# Patient Record
Sex: Female | Born: 1975 | Race: White | Hispanic: No | Marital: Married | Smoking: Never smoker
Health system: Southern US, Community
[De-identification: ages and names within clinical notes are randomized; demographics above are authoritative.]

## PROBLEM LIST (undated history)

## (undated) DIAGNOSIS — F32A Depression, unspecified: Secondary | ICD-10-CM

## (undated) DIAGNOSIS — E119 Type 2 diabetes mellitus without complications: Secondary | ICD-10-CM

## (undated) DIAGNOSIS — E1140 Type 2 diabetes mellitus with diabetic neuropathy, unspecified: Secondary | ICD-10-CM

## (undated) DIAGNOSIS — E063 Autoimmune thyroiditis: Secondary | ICD-10-CM

## (undated) DIAGNOSIS — E109 Type 1 diabetes mellitus without complications: Secondary | ICD-10-CM

## (undated) DIAGNOSIS — E785 Hyperlipidemia, unspecified: Secondary | ICD-10-CM

## (undated) DIAGNOSIS — F329 Major depressive disorder, single episode, unspecified: Secondary | ICD-10-CM

## (undated) HISTORY — DX: Type 1 diabetes mellitus without complications (CMS HCC): E10.9

## (undated) HISTORY — DX: Type 2 diabetes mellitus with diabetic neuropathy, unspecified (CMS HCC): E11.40

## (undated) HISTORY — PX: HX TONSILLECTOMY: SHX27

## (undated) HISTORY — DX: Hyperlipidemia, unspecified: E78.5

## (undated) HISTORY — DX: Depression, unspecified: F32.A

## (undated) HISTORY — PX: TEAR DUCT SURGERY: SHX793

## (undated) HISTORY — DX: Autoimmune thyroiditis: E06.3

## (undated) HISTORY — DX: Type 2 diabetes mellitus without complications: E11.9

## (undated) HISTORY — PX: TEAR DUCT PROBING: SHX793

## (undated) HISTORY — PX: CYST REMOVAL LEG: SHX6280

---
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
## 1999-08-10 HISTORY — PX: TONSILLECTOMY: SUR1361

## 2012-03-13 ENCOUNTER — Ambulatory Visit (HOSPITAL_COMMUNITY): Payer: Self-pay | Admitting: "Endocrinology

## 2014-08-09 HISTORY — PX: BUNIONECTOMY: SHX129

## 2015-05-10 HISTORY — PX: HX FOOT SURGERY: 2100001154

## 2016-07-15 ENCOUNTER — Encounter (HOSPITAL_BASED_OUTPATIENT_CLINIC_OR_DEPARTMENT_OTHER): Payer: Self-pay | Admitting: "Endocrinology

## 2016-07-16 ENCOUNTER — Encounter (HOSPITAL_BASED_OUTPATIENT_CLINIC_OR_DEPARTMENT_OTHER): Payer: Self-pay | Admitting: "Endocrinology

## 2016-07-16 ENCOUNTER — Ambulatory Visit (INDEPENDENT_AMBULATORY_CARE_PROVIDER_SITE_OTHER): Payer: BLUE CROSS/BLUE SHIELD | Admitting: "Endocrinology

## 2016-07-16 VITALS — BP 100/68 | Ht 64.0 in | Wt 164.0 lb

## 2016-07-16 DIAGNOSIS — Z6828 Body mass index (BMI) 28.0-28.9, adult: Secondary | ICD-10-CM

## 2016-07-16 DIAGNOSIS — IMO0002 Reserved for concepts with insufficient information to code with codable children: Secondary | ICD-10-CM

## 2016-07-16 DIAGNOSIS — E1065 Type 1 diabetes mellitus with hyperglycemia: Secondary | ICD-10-CM

## 2016-07-16 DIAGNOSIS — E039 Hypothyroidism, unspecified: Secondary | ICD-10-CM

## 2016-07-16 LAB — POCT BLOOD GLUCOSE/FINGERSTICK (AMB): GLUCOSE, POINT OF CARE: 112

## 2016-07-16 LAB — POCT HGB A1C: POCT HGB A1C: 8.1 % — AB (ref 4–6)

## 2016-07-16 MED ORDER — LEVOTHYROXINE 125 MCG TABLET
125.0000 ug | ORAL_TABLET | Freq: Every day | ORAL | 3 refills | Status: DC
Start: 2016-07-16 — End: 2018-10-02

## 2016-07-16 MED ORDER — BLOOD SUGAR DIAGNOSTIC STRIPS
10.0000 | ORAL_STRIP | Freq: Every day | 3 refills | Status: DC
Start: 2016-07-16 — End: 2016-07-16

## 2016-07-16 MED ORDER — LANCETS
1.0000 | Freq: Every day | 3 refills | Status: AC
Start: 2016-07-16 — End: ?

## 2016-07-16 MED ORDER — BLOOD SUGAR DIAGNOSTIC STRIPS
10.0000 | ORAL_STRIP | Freq: Every day | 3 refills | Status: DC
Start: 2016-07-16 — End: 2017-11-07

## 2016-07-16 NOTE — Progress Notes (Signed)
[facility]  [address]  [place] [address]  [phone]    Date: 07/16/2016  Name: Audrey Barker  Age: 40 y.o.    Chief Complaint: Diabetes (monitoring blood sugar 6 times daily)    HPI:    Audrey Barker is a 40 y.o. female with T1DM and Hypothyroid on insulin pump. Works night shifts in ICU in [employer] and BS's are very erratic. A1C today is 8.1% which is really high for Audrey Barker.  Pump/meter download reveals no real pattern.  Current health problems  There are no active problems to display for this patient.      Past Medical History:   Diagnosis Date   . Depression    . Diabetes mellitus type 1 ([place])    . Diabetic neuropathy ([place])    . Hashimoto's thyroiditis    . Hyperlipidemia          Past Surgical History:   Procedure Laterality Date   . HX FOOT SURGERY Right 05/2015   . HX TONSILLECTOMY     . TEAR DUCT SURGERY             Outpatient Medications Prior to Visit:  aspirin (ECOTRIN) 81 mg Oral Tablet, Delayed Release (E.C.) Once a day   escitalopram oxalate (LEXAPRO) 20 mg Oral Tablet Once a day   GLUCAGON,HUMAN RECOMBINANT (GLUCAGON EMERGENCY KIT, HUMAN, INJ) Once, as needed   insulin aspart (NOVOLOG) 100 unit/mL Subcutaneous Solution Once a day Max dose 80 units daily   Norgestrel-Ethinyl Estradiol (CRYSELLE, 28,) 0.3-30 mg-mcg Oral Tablet Once a day   Sub-Q Infusion Pump Access (SURE-T INFUSION SET) Misc Every 3 days 23" 24m   Blood Sugar Diagnostic Strip Once a day 8-10 times daily   levothyroxine (SYNTHROID) 125 mcg Oral Tablet Once a day   ONETOUCH ULTRASOFT LANCETS N/A Once a day 8-10 times daily     No facility-administered medications prior to visit.    Family Medical History     Problem Relation (Age of Onset)    Addison's disease Multiple family members    Diabetes Multiple family members, Maternal Grandfather    Heart Attack Maternal Grandfather    Heart Disease Multiple family members    High Cholesterol Multiple family members    Hypertension Multiple family members    Hypothyroidism Multiple  family members    Thyroid Disease Mother            Social History     Social History   . Marital status: Single     Spouse name: N/A   . Number of children: N/A   . Years of education: N/A     Occupational History   . Not on file.     Social History Main Topics   . Smoking status: Never Smoker   . Smokeless tobacco: Never Used   . Alcohol use No   . Drug use: Not on file   . Sexual activity: Not on file     Other Topics Concern   . Not on file     Social History Narrative   . No narrative on file        There is no immunization history on file for this patient.      ROS:   Constitutional: negative  Eyes: negative, See's a Dr. GRaquel Sarnain FCaleand no retinopathy  HEENT: negative  Respiratroy: negative  Cardiology: negative  GI: negative  GU: negative  Musculoskeletal:negative  Neuro: negative  Endocrine: Onset of T1DM as child  OBJECTIVE:   BP 100/68  Ht 1.626 m (_0 )  Wt 74.4 kg (164 lb)  BMI 28.15 kg/m2   Body mass index is 28.15 kg/(m^2).   Exam:   General:  Alert, cooperative, no distress, appears stated age.   Head:  Normocephalic, without obvious abnormality, atraumatic.   Eyes:  Conjunctivae/corneas clear. PERRL, EOMs intact. Fundi; not examined   Ears:  Not examined   Nose: Nares normal. Septum midline. Mucosa normal. No drainage or sinus tenderness.   Throat: Lips, mucosa, and tongue normal. Teeth and gums normal.   Neck: Supple, symmetrical, trachea midline, no adenopathy, thyroid: no enlargment/tenderness/nodules, no carotid bruit and no JVD.   Back:   Symmetric, no curvature. ROM normal. No CVA tenderness.   Lungs:   Clear to auscultation bilaterally.   Chest wall:  No tenderness or deformity.   Heart:  Regular rate and rhythm, S1, S2 normal, no murmur, click, rub or gallop.   Abdomen:   Not examined.   Genitalia:  Not examined    Rectal:  Not examined   Extremities: Extremities normal, atraumatic, no cyanosis or edema.    Pulses: 2+ and symmetric all extremities.   Skin: Skin color, texture, turgor  normal. No rashes or lesions   Lymph nodes: Cervical, supraclavicular, and axillary nodes normal.   Neurologic: CNII-XII intact.          ASSESSMENT & PLAN:       ICD-10-CM    1. Type 1 diabetes mellitus, uncontrolled (HCC) E10.65 MULTIVITAMIN (MULTIPLE VITAMIN ORAL)     POCT BLOOD GLUCOSE/FINGERSTICK (AMB)     POCT HGB A1C   2. Hypothyroidism, unspecified type E03.9        Audrey Barker BS's are out of control and I believe she needs the Medtronic 670 pump.  I will review Audrey Barker schedules and e-mail Audrey Barker new pump setting with a work pattern and a off work pattenr  Return in about 6 months (around 01/14/2017).        Dominga Ferry, MD  07/16/2016, 12:59

## 2016-08-22 ENCOUNTER — Other Ambulatory Visit: Payer: Self-pay

## 2016-11-12 ENCOUNTER — Other Ambulatory Visit (HOSPITAL_BASED_OUTPATIENT_CLINIC_OR_DEPARTMENT_OTHER): Payer: Self-pay | Admitting: "Endocrinology

## 2017-01-14 ENCOUNTER — Encounter (HOSPITAL_BASED_OUTPATIENT_CLINIC_OR_DEPARTMENT_OTHER): Payer: Self-pay | Admitting: "Endocrinology

## 2017-01-14 ENCOUNTER — Ambulatory Visit (INDEPENDENT_AMBULATORY_CARE_PROVIDER_SITE_OTHER): Payer: BLUE CROSS/BLUE SHIELD | Admitting: "Endocrinology

## 2017-01-14 ENCOUNTER — Other Ambulatory Visit (HOSPITAL_BASED_OUTPATIENT_CLINIC_OR_DEPARTMENT_OTHER): Payer: Self-pay | Admitting: "Endocrinology

## 2017-01-14 VITALS — BP 104/60 | Ht 64.0 in | Wt 162.0 lb

## 2017-01-14 DIAGNOSIS — E039 Hypothyroidism, unspecified: Secondary | ICD-10-CM

## 2017-01-14 DIAGNOSIS — E1065 Type 1 diabetes mellitus with hyperglycemia: Secondary | ICD-10-CM

## 2017-01-14 DIAGNOSIS — IMO0002 Reserved for concepts with insufficient information to code with codable children: Secondary | ICD-10-CM

## 2017-01-14 LAB — POCT BLOOD GLUCOSE/FINGERSTICK (AMB): GLUCOSE, POINT OF CARE: 143

## 2017-01-14 MED ORDER — GLUCAGON 1 MG/ML SOLUTION FOR INJECTION
INTRAMUSCULAR | 3 refills | Status: AC
Start: 2017-01-14 — End: ?

## 2017-01-14 NOTE — Progress Notes (Signed)
[facility]  [address]  [place], [place] [id number]          Audrey GipStacy N Gilbertson  Female  04/18/1976, Age: 41 y.o.  [id number]    Date of Service: 01/14/2017    Chief complaint: Diabetes (monitoring blood sugar 4-6 times daily)      HISTORY OF PRESENT ILLNESS:     Audrey Barker is a 41 y.o. female with child onset T1 DM and hypothyroidism on the insulin pump.  She is doing well, her A1c is down to 7.4% and she is having less frequent hypoglycemia.  She is still working night shift and in ICU in [place] in the major pattern she has noticed is when she is not working her morning blood sugar goes up between 7:00 a.m. and 10:00 a.m..     Current health problems:  Past Medical History:   Diagnosis Date   . Depression    . Diabetes mellitus type 1 (HCC)    . Diabetic neuropathy (HCC)    . Hashimoto's thyroiditis    . Hyperlipidemia            Allergies:  Allergies   Allergen Reactions   . Aleve [Naproxen Sodium]        Current medications:  Outpatient Prescriptions Marked as Taking for the 01/14/17 encounter (Office Visit) with Latrelle DodrillSchwartz, Maragret Vanacker Lee, MD   Medication Sig   . aspirin (ECOTRIN) 81 mg Oral Tablet, Delayed Release (E.C.) Once a day   . Blood Sugar Diagnostic Strip 10 Strips by Apply Topically route Once a day 8-10 times daily   . escitalopram oxalate (LEXAPRO) 20 mg Oral Tablet Once a day   . Lancets (ONETOUCH ULTRASOFT LANCETS) Misc 1 Container by Apply externally route Once a day 8-10 times daily   . levothyroxine (SYNTHROID) 125 mcg Oral Tablet Take 1 Tab (125 mcg total) by mouth Once a day   . MULTIVITAMIN (MULTIPLE VITAMIN ORAL) Take 1 Tab by mouth Once a day   . Norgestrel-Ethinyl Estradiol (CRYSELLE, 28,) 0.3-30 mg-mcg Oral Tablet Once a day   . NOVOLOG U-100 INSULIN ASPART 100 unit/mL Subcutaneous Solution INJECT MAXIMUM 80 UNITS UNDER THE SKIN DAILY VIA INSULIN PUMP   . Sub-Q Infusion Pump Access (SURE-T INFUSION SET) Misc Every 3 days 23" 6mm       Health  history:    There is no immunization history on file for this patient.    SUBJECTIVE:    Review of Systems   Constitutional: Negative.    HENT: Negative.    Eyes:        No retinopathy & get annual eye exam [place] with Dr. Berneice GandyGrubb   Respiratory: Negative.    Cardiovascular: Negative.    Gastrointestinal: Negative.    Genitourinary: Negative.    Musculoskeletal: Negative.    Skin: Negative.    Neurological: Negative.    Endo/Heme/Allergies:        T1DM & hypothyroid and doing well   Psychiatric/Behavioral: Negative.        Diabetes Monitors  A1C - Glucose - Lipids Microalbumin   Results in Last 18 Months   Lab Test 07/16/16 01/14/17   POCTHA1C  8.1*  7.4*    No results for input(s): MICALBRNUR, MICALBCRERAT in the last [id number] hours.       OBJECTIVE:     BP 104/60  Ht 1.626 m (5\' 4" )  Wt 73.5 kg (162 lb)  BMI 27.81 kg/m2  Physical Exam   Constitutional: She is well-developed, well-nourished, and in no distress.   HENT:   Head: Normocephalic.   Eyes: Conjunctivae and EOM are normal. Pupils are equal, round, and reactive to light.   Neck: Normal range of motion. Neck supple.   Cardiovascular: Normal rate, regular rhythm and normal heart sounds.    Pulmonary/Chest: Effort normal and breath sounds normal.   Abdominal:   Deferred   Genitourinary:   Genitourinary Comments: Deferred   Musculoskeletal: Normal range of motion.   Neurological: She is alert.   Skin: Skin is warm.   Psychiatric: Affect normal.   Diabetic foot exam:  Both feet without edema or ulcerations. Pulses normal bilaterally. Sensation normal bilaterally      ASSESSMENT & PLAN:     T1 DM; CONTROLLED  HYPOTHYROIDISM     I adjusted her 7:00 a.m. basal rate by 0.025 units because the goes up at 8:30 a.m. by 0.05 units.  If she is still high she could go ahead and increase the 7:00 a.m. basal to match her 8:30 a.m. basal.  We also discussed temporary basal post exercise because she goes low after exercise instead of during.  Final she does go low occasionally at  work when she is really busy and a remind her again about using a temporary basal.  She is due for annual labs I printed those out for her to get in [place].    Orders Placed This Encounter   . POCT BLOOD GLUCOSE/FINGERSTICK (AMB)   . POCT HGB A1C               The patient was given the opportunity to ask questions and those questions were answered to the patient's satisfaction. The patient was encouraged to call with any additional questions or concerns.     Discussed with patient effects and side effects of medications. Medication safety was discussed. A copy of the patient's medication list was printed and given to the patient.     The patient was informed to contact the office within 7 business days if a message/lab results/referral/imaging results have not been conveyed to the patient.    Latrelle Dodrill, MD  01/14/2017, 08:44  [facility]  [address]  [place], [place] [address]  Dept: [phone]  Dept Fax: [phone]

## 2017-01-28 ENCOUNTER — Other Ambulatory Visit (HOSPITAL_BASED_OUTPATIENT_CLINIC_OR_DEPARTMENT_OTHER): Payer: Self-pay

## 2017-01-31 NOTE — Progress Notes (Signed)
Patient notified.    Kate SableBeverly Tee Richeson, [place]  0/98/11916/25/2018, 10:19

## 2017-03-28 ENCOUNTER — Ambulatory Visit (HOSPITAL_BASED_OUTPATIENT_CLINIC_OR_DEPARTMENT_OTHER): Payer: Self-pay | Admitting: "Endocrinology

## 2017-07-14 ENCOUNTER — Ambulatory Visit (INDEPENDENT_AMBULATORY_CARE_PROVIDER_SITE_OTHER): Payer: BLUE CROSS/BLUE SHIELD | Admitting: "Endocrinology

## 2017-07-14 ENCOUNTER — Encounter (HOSPITAL_BASED_OUTPATIENT_CLINIC_OR_DEPARTMENT_OTHER): Payer: Self-pay | Admitting: "Endocrinology

## 2017-07-14 VITALS — BP 96/68 | Ht 64.0 in | Wt 166.5 lb

## 2017-07-14 DIAGNOSIS — E039 Hypothyroidism, unspecified: Secondary | ICD-10-CM

## 2017-07-14 DIAGNOSIS — E119 Type 2 diabetes mellitus without complications: Secondary | ICD-10-CM

## 2017-07-14 DIAGNOSIS — E109 Type 1 diabetes mellitus without complications: Secondary | ICD-10-CM

## 2017-07-14 DIAGNOSIS — Z6827 Body mass index (BMI) 27.0-27.9, adult: Secondary | ICD-10-CM

## 2017-07-14 LAB — POCT HGB A1C: POCT HGB A1C: 7.2 % — AB (ref 4–6)

## 2017-07-14 LAB — POCT BLOOD GLUCOSE/FINGERSTICK (AMB): GLUCOSE, POINT OF CARE: 164

## 2017-07-14 NOTE — Progress Notes (Signed)
[facility]  [address]  [place], [place] [id number]          Audrey Barker  Female  02/05/1976, Age: 41 y.o.  [id number]    Date of Service: 07/14/2017    Chief complaint: Diabetes (checks BS 6+ times per day)      HISTORY OF PRESENT ILLNESS:     Audrey Barker is a 41 y.o. female with child onset T1 DM and hypothyroidism on the insulin pump.  She is doing well, getting the new Medtronic 670 G pump today or tomorrow; however they have not scheduled training [place] in [place] and I advised her to contact them. 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  This will be a day shift she can't week.          Current health problems:  Past Medical History:   Diagnosis Date   . Depression    . Diabetes mellitus type 1 (CMS HCC)    . Diabetic neuropathy (CMS HCC)    . Hashimoto's thyroiditis    . Hyperlipidemia            Allergies:  Allergies   Allergen Reactions   . Aleve [Naproxen Sodium]        Current medications:    Outpatient Prescriptions Marked as Taking for the 07/14/17 encounter (Office Visit) with Latrelle DodrillSchwartz, Heberto Sturdevant Lee, MD   Medication Sig   . aspirin (ECOTRIN) 81 mg Oral Tablet, Delayed Release (E.C.) Once a day   . Blood Sugar Diagnostic Strip 10 Strips by Apply Topically route Once a day 8-10 times daily   . escitalopram oxalate (LEXAPRO) 20 mg Oral Tablet Once a day   . glucagon (GLUCAGEN) 1 mg/mL Injection Recon Soln For use in severe hypoglycemia   . Lancets (ONETOUCH ULTRASOFT LANCETS) Misc 1 Container by Apply externally route Once a day 8-10 times daily   . levothyroxine (SYNTHROID) 125 mcg Oral Tablet Take 1 Tab (125 mcg total) by mouth Once a day   . MULTIVITAMIN  (MULTIPLE VITAMIN ORAL) Take 1 Tab by mouth Once a day   . Norgestrel-Ethinyl Estradiol (CRYSELLE, 28,) 0.3-30 mg-mcg Oral Tablet Once a day   . NOVOLOG U-100 INSULIN ASPART 100 unit/mL Subcutaneous Solution INJECT MAXIMUM 80 UNITS UNDER THE SKIN DAILY VIA INSULIN PUMP   . Sub-Q Infusion Pump Access (SURE-T INFUSION SET) Misc Every 3 days 23" 6mm         SUBJECTIVE:    Review of Systems     Constitutional: Negative.    HEENT: Negative.  Eyes:  No retinopathy & get annual eye exam [place] with Dr. Berneice GandyGrubb   Respiratory: Negative.    Cardiovascular: Negative.    Gastrointestinal: Negative.    Genitourinary: Negative.    Musculoskeletal: Negative.    Skin: Negative.    Neurological: Negative.    Endo/Heme/Allergies: Onset of T1DM in childhood & then hypothyroidism as an adolescent.    Psychiatric/Behavioral: Negative.        Diabetes Monitors  A1C - Glucose - Lipids Microalbumin   Results in Last 18 Months   Lab Test 07/16/16 01/14/17 07/14/17   POCTHA1C  8.1*  7.4*  7.2*    No results for input(s): MICALBRNUR, MICALBCRERAT in the last [id number] hours.       OBJECTIVE:     BP 104/60  Ht 1.626 m (5\' 4" )  Wt 73.5 kg (162 lb)  BMI 27.81 kg/m2     Physical Exam     Constitutional: She is well-developed, well-nourished, middle-aged female and in no distress.   HEENT: Head: Normocephalic. Eyes: Conjunctivae and EOM are normal. Pupils are equal, round, and reactive to light.   Neck: Normal range of motion. Neck supple.   Cardiovascular: Normal rate, regular rhythm and normal heart sounds.    Pulmonary/Chest: Effort normal and breath sounds normal.   Abdominal: Deferred   Genitourinary: Deferred   Musculoskeletal: Normal range of motion.   Neurological: She is alert.   Skin: Skin is warm.   Psychiatric: Affect normal.   Diabetic foot exam:  Both feet without edema or ulcerations. Pulses normal bilaterally. Sensation normal bilaterally      ASSESSMENT & PLAN:     T1 DM; CONTROLLED  HYPOTHYROIDISM     I adjusted her 7:00 a.m. basal rate  by 0.025 units because the goes up at 8:30 a.m. by 0.05 units.  If she is still high she could go ahead and increase the 7:00 a.m. basal to match her 8:30 a.m. basal.  We also discussed temporary basal post exercise because she goes low after exercise instead of during.  Final she does go low occasionally at work when she is really busy and a remind her again about using a temporary basal.  She is due for annual labs I printed those out for [place] get in [place].    Orders Placed This Encounter   . POCT HGB A1C   . POCT BLOOD GLUCOSE/FINGERSTICK (AMB)         Return in about 6 months (around 01/12/2018).      The patient was given the opportunity to ask questions and those questions were answered to the patient's satisfaction. The patient was encouraged to call with any additional questions or concerns.     Discussed with patient effects and side effects of medications. Medication safety was discussed. A copy of the patient's medication list was printed and given to the patient.     The patient was informed to contact the office within 7 business days if a message/lab results/referral/imaging results have not been conveyed to the patient.    Latrelle DodrillFrank Lee Greenlee Ancheta, MD  01/14/2017, 08:44  [facility]  [address]  [place], [place] [id number]  Dept: [phone]  Dept Fax: [phone]

## 2017-07-15 ENCOUNTER — Encounter (HOSPITAL_BASED_OUTPATIENT_CLINIC_OR_DEPARTMENT_OTHER): Payer: Self-pay | Admitting: "Endocrinology

## 2017-08-11 ENCOUNTER — Encounter (HOSPITAL_BASED_OUTPATIENT_CLINIC_OR_DEPARTMENT_OTHER): Payer: Self-pay | Admitting: "Endocrinology

## 2017-11-07 ENCOUNTER — Other Ambulatory Visit (HOSPITAL_BASED_OUTPATIENT_CLINIC_OR_DEPARTMENT_OTHER): Payer: Self-pay | Admitting: "Endocrinology

## 2017-11-07 MED ORDER — CONTOUR NEXT TEST STRIPS
ORAL_STRIP | 3 refills | Status: DC
Start: 2017-11-07 — End: 2018-10-02

## 2018-01-12 ENCOUNTER — Encounter (HOSPITAL_BASED_OUTPATIENT_CLINIC_OR_DEPARTMENT_OTHER): Payer: Self-pay | Admitting: "Endocrinology

## 2018-01-12 ENCOUNTER — Ambulatory Visit (INDEPENDENT_AMBULATORY_CARE_PROVIDER_SITE_OTHER): Payer: 59 | Admitting: "Endocrinology

## 2018-01-12 VITALS — BP 102/64 | Ht 64.0 in | Wt 170.5 lb

## 2018-01-12 DIAGNOSIS — E039 Hypothyroidism, unspecified: Secondary | ICD-10-CM

## 2018-01-12 DIAGNOSIS — IMO0002 Reserved for concepts with insufficient information to code with codable children: Secondary | ICD-10-CM

## 2018-01-12 DIAGNOSIS — E1065 Type 1 diabetes mellitus with hyperglycemia: Secondary | ICD-10-CM

## 2018-01-12 LAB — POCT BLOOD GLUCOSE/FINGERSTICK (AMB): GLUCOSE, POINT OF CARE: 190

## 2018-01-12 MED ORDER — INSULIN ASPART (NIACINAMIDE)(U-100) 100 UNIT/ML(3 ML) SUBCUTANEOUS PEN
PEN_INJECTOR | SUBCUTANEOUS | Status: DC
Start: 2018-01-12 — End: 2018-10-02

## 2018-01-12 NOTE — Progress Notes (Signed)
[facility]  [address]  [place], [place] [id number]          Audrey GipStacy N Barker  Female  06/19/1976, Age: 42 y.o.  [id number]    Date of Service: 01/12/2018    Chief complaint: Diabetes (checks BS 4-8 times per day)      HISTORY OF PRESENT ILLNESS:     Audrey GipStacy N Barker is a 42 y.o. female with child-onset T1 DM and hypothyroidism on the Medtronic 670 G pump. This is a bi-annual appointment [place] lives in  [place]. Her A1c today is again 7.2% and she is having less frequent hypoglycemia.  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  She feels that the pump is actually keeping her at a target glucose of 170 and therefore get change the target settings on her pump and get it down a little bit better.  Her pump/see GM download does reveal that she is slightly high postprandially but she also admits she is snacking continuously at work because there is food there and she is really going to try to cut that out and increase her exercise       Current health problems:  Past Medical History:   Diagnosis Date   . Depression    . Diabetes mellitus type 1 (CMS HCC)    . Diabetic neuropathy (CMS HCC)    . Hashimoto's thyroiditis    . Hyperlipidemia            Allergies:  Allergies   Allergen Reactions   . Aleve [Naproxen Sodium]        Current medications:    Outpatient Medications Marked as Taking for the 01/12/18 encounter (Office Visit) with Latrelle DodrillSchwartz, Kawana Hegel Lee, MD   Medication Sig   . aspirin (ECOTRIN) 81 mg Oral Tablet, Delayed Release (E.C.) Once a day   . CONTOUR NEXT TEST STRIPS Strip Testing blood sugar 8-10 times daily   . escitalopram oxalate (LEXAPRO) 20 mg Oral Tablet Once a day   . glucagon (GLUCAGEN) 1 mg/mL Injection Recon Soln For use in severe hypoglycemia   . Lancets (ONETOUCH ULTRASOFT LANCETS) Misc 1 Container by Apply externally route Once a day 8-10 times daily   . levothyroxine (SYNTHROID) 125 mcg Oral Tablet Take 1 Tab (125 mcg total) by  mouth Once a day   . MULTIVITAMIN (MULTIPLE VITAMIN ORAL) Take 1 Tab by mouth Once a day   . Norgestrel-Ethinyl Estradiol (CRYSELLE, 28,) 0.3-30 mg-mcg Oral Tablet Once a day   . NOVOLOG U-100 INSULIN ASPART 100 unit/mL Subcutaneous Solution INJECT MAXIMUM 80 UNITS UNDER THE SKIN DAILY VIA INSULIN PUMP   . Sub-Q Infusion Pump Access (SURE-T INFUSION SET) Misc Every 3 days 23" 6mm         SUBJECTIVE:    Review of Systems     Constitutional: Negative.    HEENT: Negative.  Eyes:  No retinopathy & get annual eye exam [place] with Dr. Berneice GandyGrubb   Respiratory: Negative.    Cardiovascular: Negative.    Gastrointestinal: Negative.    Genitourinary: Menses normal on BCP.    Musculoskeletal: Negative.    Skin: Negative.    Neurological: Negative.    Endo/Heme/Allergies: Onset of T1DM in childhood & then hypothyroidism as an adolescent.    Psychiatric/Behavioral: Negative.        Diabetes Monitors  A1C - Glucose - Lipids Microalbumin   Results in Last 18 Months   Lab  Test 01/14/17 07/14/17 01/12/18   POCTHA1C 7.4* 7.2* 7.2*    No results for input(s): MICALBRNUR, MICALBCRERAT in the last 09811 hours.       OBJECTIVE:     BP 104/60  Ht 1.626 m (5\' 4" )  Wt 73.5 kg (162 lb)  BMI 27.81 kg/m2     Physical Exam     Constitutional: She is well-developed, well-nourished, middle-aged female and in no distress.   HEENT: Head: Normocephalic. Eyes: Conjunctivae and EOM are normal. Pupils are equal, round, and reactive to light.   Neck: Normal range of motion. Neck supple.   Cardiovascular: Normal rate, regular rhythm and normal heart sounds.    Pulmonary/Chest: Effort normal and breath sounds normal.   Abdominal: Deferred   Genitourinary: Deferred   Musculoskeletal: Normal range of motion.   Neurological: She is alert.   Skin: Skin is warm.   Psychiatric: Affect normal.   Diabetic foot exam:  Both feet without edema or ulcerations. Pulses normal bilaterally. Sensation normal bilaterally      ASSESSMENT & PLAN:     T1DM;  Controlled  Hypothyroidism      She is due for annual labs I printed those out for her to get in [place].  I gave her sample FiASP to see if the more rapid acting insulin helps her postprandial glucose control and we will have our pump educator adjust the target settings on her pump.    Orders Placed This Encounter   . POCT HGB A1C   . POCT BLOOD GLUCOSE/FINGERSTICK (AMB)         No follow-ups on file.      The patient was given the opportunity to ask questions and those questions were answered to the patient's satisfaction. The patient was encouraged to call with any additional questions or concerns.     Discussed with patient effects and side effects of medications. Medication safety was discussed. A copy of the patient's medication list was printed and given to the patient.     The patient was informed to contact the office within 7 business days if a message/lab results/referral/imaging results have not been conveyed to the patient.    Latrelle Dodrill, MD  01/14/2017, 08:44  [facility]  [address]  [place], [place] [address]  Dept: [phone]  Dept Fax: [phone]

## 2018-01-26 ENCOUNTER — Other Ambulatory Visit (HOSPITAL_BASED_OUTPATIENT_CLINIC_OR_DEPARTMENT_OTHER): Payer: Self-pay

## 2018-01-27 ENCOUNTER — Encounter (HOSPITAL_BASED_OUTPATIENT_CLINIC_OR_DEPARTMENT_OTHER): Payer: Self-pay | Admitting: "Endocrinology

## 2018-01-30 NOTE — Progress Notes (Signed)
Patient notified.    Kate SableBeverly Nathanie Ottley, [place]  4/01/02726/24/2019, 08:49

## 2018-07-13 ENCOUNTER — Encounter (HOSPITAL_BASED_OUTPATIENT_CLINIC_OR_DEPARTMENT_OTHER): Payer: Self-pay | Admitting: "Endocrinology

## 2018-10-02 ENCOUNTER — Encounter (HOSPITAL_BASED_OUTPATIENT_CLINIC_OR_DEPARTMENT_OTHER): Payer: Self-pay | Admitting: "Endocrinology

## 2018-10-02 ENCOUNTER — Other Ambulatory Visit: Payer: Self-pay

## 2018-10-02 ENCOUNTER — Ambulatory Visit (INDEPENDENT_AMBULATORY_CARE_PROVIDER_SITE_OTHER): Payer: 59 | Admitting: "Endocrinology

## 2018-10-02 VITALS — BP 118/70 | Ht 64.0 in | Wt 174.0 lb

## 2018-10-02 DIAGNOSIS — F329 Major depressive disorder, single episode, unspecified: Secondary | ICD-10-CM

## 2018-10-02 DIAGNOSIS — E1065 Type 1 diabetes mellitus with hyperglycemia: Secondary | ICD-10-CM

## 2018-10-02 DIAGNOSIS — Z9641 Presence of insulin pump (external) (internal): Secondary | ICD-10-CM

## 2018-10-02 DIAGNOSIS — F32A Depression, unspecified: Secondary | ICD-10-CM

## 2018-10-02 DIAGNOSIS — E039 Hypothyroidism, unspecified: Secondary | ICD-10-CM

## 2018-10-02 LAB — POCT BLOOD GLUCOSE/FINGERSTICK (AMB): GLUCOSE, POINT OF CARE: 146

## 2018-10-02 LAB — POCT HGB A1C: POCT HGB A1C: 7.5 % — AB (ref 4–6)

## 2018-10-02 MED ORDER — CONTOUR NEXT TEST STRIPS
ORAL_STRIP | 3 refills | Status: DC
Start: 2018-10-02 — End: 2019-10-15

## 2018-10-02 MED ORDER — INSULIN ASPART U-100  100 UNIT/ML SUBCUTANEOUS SOLUTION
SUBCUTANEOUS | 3 refills | Status: DC
Start: 2018-10-02 — End: 2019-10-15

## 2018-10-02 MED ORDER — LEVOTHYROXINE 125 MCG TABLET
125.0000 ug | ORAL_TABLET | Freq: Every day | ORAL | 3 refills | Status: DC
Start: 2018-10-02 — End: 2019-07-26

## 2018-10-02 NOTE — Progress Notes (Signed)
[facility]  [address]  [place], [place] [address]          Audrey Barker  Female  12/31/1975, Age: 43 y.o.  [id number]    Date of Service: 10/02/2018    Chief complaint: Diabetes (monitoring blood sughar 4+ times daily with Guardian CGM)      HISTORY OF PRESENT ILLNESS:     Audrey Barker is a 43 y.o. 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 This is her bi-annual appointment as she lives in [place], [place] in flies up twice a year to see her family the same time. Her A1c today is 7.5% and she is having less frequent hypoglycemia.  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  She feels that the pump is actually keeping her at a target glucose of 170 and therefore get change the target settings on her pump and get it down a little bit better.  Her pump/CGM download does reveal that she is slightly high postprandially but she also admits she is snacking continuously at work because there is food there and she is really going to try to cut that out and increase her exercise       Current health problems:  Past Medical History:   Diagnosis Date   . Depression    . Diabetes mellitus type 1 (CMS HCC)    . Diabetic neuropathy (CMS HCC)    . Hashimoto's thyroiditis    . Hyperlipidemia        Past Surgical History:   Procedure Laterality Date   . HX FOOT SURGERY Right 05/2015   . HX TONSILLECTOMY     . TEAR DUCT SURGERY         Allergies:  Allergies   Allergen Reactions   . Aleve [Naproxen Sodium]      Current Outpatient Medications   Medication Sig   . aspirin (ECOTRIN) 81 mg Oral Tablet, Delayed Release (E.C.) Once a day   . CONTOUR NEXT TEST STRIPS Strip Testing blood sugar 8-10 times daily   . escitalopram oxalate (LEXAPRO) 20 mg Oral Tablet Once a day   . glucagon (GLUCAGEN) 1 mg/mL Injection Recon Soln For use in severe hypoglycemia   . insulin aspart, niacinamide, (FIASP FLEXTOUCH U-100  INSULIN) 100 unit/mL (3 mL) Subcutaneous Insulin Pen Use in insulin pump as directed   . Lancets (ONETOUCH ULTRASOFT LANCETS) Misc 1 Container by Apply externally route Once a day 8-10 times daily   . levothyroxine (SYNTHROID) 125 mcg Oral Tablet Take 1 Tab (125 mcg total) by mouth Once a day   . MULTIVITAMIN (MULTIPLE VITAMIN ORAL) Take 1 Tab by mouth Once a day   . Norgestrel-Ethinyl Estradiol (CRYSELLE, 28,) 0.3-30 mg-mcg Oral Tablet Once a day   . NOVOLOG U-100 INSULIN ASPART 100 unit/mL Subcutaneous Solution INJECT MAXIMUM 80 UNITS UNDER THE SKIN DAILY VIA INSULIN PUMP   . Sub-Q Infusion Pump Access (SURE-T INFUSION SET) Misc Every 3 days 23" 64mm       SUBJECTIVE:    Review of Systems     Constitutional: Negative.    HEENT: Negative.  Eyes:  No retinopathy & gets annual eye exam [place] with Dr. Berneice Gandy   Respiratory: Negative.    Cardiovascular: Negative.    Gastrointestinal: Negative.    Genitourinary: Menses normal on BCP.  Married but does not plan on having children   Musculoskeletal: Negative.  Skin: Negative.    Neurological: Negative.    Endo/Heme/Allergies: Onset of T1DM in childhood & then hypothyroidism as an adolescent, LDL 80/HDL73.  She has gained a little bit a weight this winter.    Psychiatric/Behavioral: Negative.        Diabetes Monitors  A1C - Glucose - Lipids Microalbumin   Results in Last 18 Months   Lab Test 07/14/17 01/12/18   POCTHA1C 7.2* 7.2*    No results for input(s): MICALBRNUR, MICALBCRERAT in the last 00923 hours.       OBJECTIVE:     BP 104/60  Ht 1.626 m (5\' 4" )  Wt 73.5 kg (162 lb)  BMI 27.81 kg/m2     Physical Exam     Constitutional: She is well-developed, well-nourished, middle-aged female and in no distress.   HEENT: Head: Normocephalic. Eyes: Conjunctivae and EOM are normal. Pupils are equal, round, and reactive to light.   Neck: Normal range of motion. Neck supple.   Cardiovascular: Normal rate, regular rhythm and normal heart sounds.    Pulmonary/Chest: Effort normal and  breath sounds normal.   Abdominal: Deferred   Genitourinary: Deferred   Musculoskeletal: Normal range of motion.   Neurological: She is alert.   Skin: Skin is warm.   Psychiatric: Affect normal.   Diabetic foot exam:  Both feet without edema or ulcerations. Pulses normal bilaterally. Sensation normal bilaterally      ASSESSMENT & PLAN:     T1DM; Controlled  Hypothyroidism     I stressed that she be more accurate carb counting and to bolus before she eats.  She is higher postprandially and then develops delayed hypoglycemia which thankfully is prevented by the low glucose threshold suspend on the pump.  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       Orders Placed This Encounter   . POCT BLOOD GLUCOSE/FINGERSTICK (AMB)   . POCT HGB A1C         The patient was given the opportunity to ask questions and those questions were answered to the patient's satisfaction. The patient was encouraged to call with any additional questions or concerns.     Discussed with patient effects and side effects of medications. Medication safety was discussed. A copy of the patient's medication list was printed and given to the patient.     The patient was informed to contact the office within 7 business days if a message/lab results/referral/imaging results have not been conveyed to the patient.      Latrelle Dodrill, MD  10/02/2018, 08:54[facility]  [address]  [place], [place] [address]  Dept: [phone]  Dept Fax: [phone]

## 2018-10-02 NOTE — Patient Instructions (Signed)
Try to be slightly more accurate carb counting and to bolus before you eat

## 2018-12-18 ENCOUNTER — Other Ambulatory Visit (HOSPITAL_BASED_OUTPATIENT_CLINIC_OR_DEPARTMENT_OTHER): Payer: Self-pay

## 2018-12-19 ENCOUNTER — Other Ambulatory Visit (HOSPITAL_BASED_OUTPATIENT_CLINIC_OR_DEPARTMENT_OTHER): Payer: Self-pay | Admitting: "Endocrinology

## 2018-12-19 DIAGNOSIS — E1065 Type 1 diabetes mellitus with hyperglycemia: Secondary | ICD-10-CM

## 2018-12-19 NOTE — Progress Notes (Signed)
Patient notified.    Kate Sable, [place]  02/18/4579, 10:10

## 2019-01-15 ENCOUNTER — Other Ambulatory Visit (HOSPITAL_BASED_OUTPATIENT_CLINIC_OR_DEPARTMENT_OTHER): Payer: Self-pay

## 2019-03-14 ENCOUNTER — Encounter (HOSPITAL_BASED_OUTPATIENT_CLINIC_OR_DEPARTMENT_OTHER): Payer: Self-pay | Admitting: "Endocrinology

## 2019-04-02 ENCOUNTER — Encounter (HOSPITAL_BASED_OUTPATIENT_CLINIC_OR_DEPARTMENT_OTHER): Payer: Self-pay | Admitting: "Endocrinology

## 2019-07-25 ENCOUNTER — Other Ambulatory Visit (HOSPITAL_BASED_OUTPATIENT_CLINIC_OR_DEPARTMENT_OTHER): Payer: Self-pay | Admitting: "Endocrinology

## 2019-07-26 NOTE — Telephone Encounter (Signed)
Pharmacy requesting refill on medication for the pt. Ordered and sent to Dr. Tessa Lerner for sign off and review.  Bess Kinds, LPN  49/82/6415, 83:09

## 2019-08-21 ENCOUNTER — Encounter (HOSPITAL_BASED_OUTPATIENT_CLINIC_OR_DEPARTMENT_OTHER): Payer: Self-pay | Admitting: "Endocrinology

## 2019-08-27 ENCOUNTER — Encounter (HOSPITAL_BASED_OUTPATIENT_CLINIC_OR_DEPARTMENT_OTHER): Payer: Self-pay | Admitting: "Endocrinology

## 2019-10-13 ENCOUNTER — Other Ambulatory Visit (HOSPITAL_BASED_OUTPATIENT_CLINIC_OR_DEPARTMENT_OTHER): Payer: Self-pay | Admitting: "Endocrinology

## 2019-10-16 ENCOUNTER — Ambulatory Visit (HOSPITAL_BASED_OUTPATIENT_CLINIC_OR_DEPARTMENT_OTHER): Payer: Self-pay | Admitting: "Endocrinology

## 2019-10-16 DIAGNOSIS — Z83438 Family history of other disorder of lipoprotein metabolism and other lipidemia: Secondary | ICD-10-CM

## 2019-10-16 DIAGNOSIS — E039 Hypothyroidism, unspecified: Secondary | ICD-10-CM

## 2019-10-16 DIAGNOSIS — E1065 Type 1 diabetes mellitus with hyperglycemia: Secondary | ICD-10-CM

## 2019-10-16 NOTE — Telephone Encounter (Signed)
Patient wants to get lab work done before her insurance in [place] is stopped as she is moving to [place] to be closer to family.        Audrey Barker, [place]  09/12/4008, 27:25

## 2019-11-09 ENCOUNTER — Encounter (HOSPITAL_BASED_OUTPATIENT_CLINIC_OR_DEPARTMENT_OTHER): Payer: Self-pay | Admitting: "Endocrinology

## 2019-11-12 ENCOUNTER — Other Ambulatory Visit (HOSPITAL_BASED_OUTPATIENT_CLINIC_OR_DEPARTMENT_OTHER): Payer: Self-pay

## 2019-11-13 NOTE — Progress Notes (Signed)
Patient notified.    Kate Sable, [place]  11/12/9505, 22:57

## 2019-12-24 ENCOUNTER — Encounter (HOSPITAL_BASED_OUTPATIENT_CLINIC_OR_DEPARTMENT_OTHER): Payer: Self-pay | Admitting: "Endocrinology

## 2020-03-26 ENCOUNTER — Other Ambulatory Visit (HOSPITAL_COMMUNITY): Payer: Self-pay | Admitting: Family Medicine

## 2020-03-26 DIAGNOSIS — Z9641 Presence of insulin pump (external) (internal): Secondary | ICD-10-CM | POA: Diagnosis not present

## 2020-03-26 DIAGNOSIS — Z0000 Encounter for general adult medical examination without abnormal findings: Secondary | ICD-10-CM | POA: Diagnosis not present

## 2020-03-26 DIAGNOSIS — E108 Type 1 diabetes mellitus with unspecified complications: Secondary | ICD-10-CM | POA: Diagnosis not present

## 2020-03-26 DIAGNOSIS — E039 Hypothyroidism, unspecified: Secondary | ICD-10-CM | POA: Diagnosis not present

## 2020-03-26 DIAGNOSIS — F324 Major depressive disorder, single episode, in partial remission: Secondary | ICD-10-CM | POA: Diagnosis not present

## 2020-03-26 DIAGNOSIS — E7800 Pure hypercholesterolemia, unspecified: Secondary | ICD-10-CM | POA: Diagnosis not present

## 2020-03-26 MED FILL — ROSUVASTATIN CALCIUM 10 MG: 10 | 90 days supply | Qty: 90 | Fill #0

## 2020-03-26 MED FILL — LEVOTHYROXINE 125 MCG TABLE: 125 | 90 days supply | Qty: 90 | Fill #0

## 2020-03-26 MED FILL — ESCITALOPRAM 20 MG TABLET: 20 | 90 days supply | Qty: 90 | Fill #0

## 2020-03-27 ENCOUNTER — Other Ambulatory Visit: Payer: Self-pay | Admitting: Family Medicine

## 2020-03-27 DIAGNOSIS — Z1231 Encounter for screening mammogram for malignant neoplasm of breast: Secondary | ICD-10-CM

## 2020-03-31 DIAGNOSIS — Z794 Long term (current) use of insulin: Secondary | ICD-10-CM | POA: Diagnosis not present

## 2020-03-31 DIAGNOSIS — E109 Type 1 diabetes mellitus without complications: Secondary | ICD-10-CM | POA: Diagnosis not present

## 2020-04-07 ENCOUNTER — Ambulatory Visit
Admission: RE | Admit: 2020-04-07 | Discharge: 2020-04-07 | Disposition: A | Discharge status: Home / Self Care | Payer: 59 | Source: Ambulatory Visit | Attending: Family Medicine | Admitting: Family Medicine

## 2020-04-07 ENCOUNTER — Other Ambulatory Visit: Payer: Self-pay

## 2020-04-07 DIAGNOSIS — Z1231 Encounter for screening mammogram for malignant neoplasm of breast: Secondary | ICD-10-CM

## 2020-05-08 MED FILL — VALACYCLOVIR HCL 500 MG TAB: 500 | 30 days supply | Qty: 30 | Fill #0

## 2020-05-08 MED FILL — ELINEST 0.3-30 MG-MCG TABS: 0.3-30 | 84 days supply | Qty: 84 | Fill #0

## 2020-06-02 ENCOUNTER — Other Ambulatory Visit: Payer: Self-pay

## 2020-06-02 ENCOUNTER — Ambulatory Visit: Payer: 59 | Admitting: Internal Medicine

## 2020-06-02 ENCOUNTER — Other Ambulatory Visit: Payer: Self-pay | Admitting: Internal Medicine

## 2020-06-02 ENCOUNTER — Encounter: Payer: Self-pay | Admitting: Internal Medicine

## 2020-06-02 VITALS — BP 110/78 | HR 77 | Wt 178.2 lb

## 2020-06-02 DIAGNOSIS — E1065 Type 1 diabetes mellitus with hyperglycemia: Secondary | ICD-10-CM

## 2020-06-02 LAB — POCT GLYCOSYLATED HEMOGLOBIN (HGB A1C): Hemoglobin A1C: 8.1 % — AB (ref 4.0–5.6)

## 2020-06-02 MED ORDER — GLUCAGON 3 MG/DOSE NA POWD
3.0000 mg | Freq: Once | NASAL | 11 refills | Status: DC | PRN
Start: 2020-06-02 — End: 2020-06-02

## 2020-06-02 MED FILL — BAQSIMI ONE PACK 3 MG/DOSE: 3 | 1 days supply | Qty: 1 | Fill #0

## 2020-06-02 NOTE — Progress Notes (Addendum)
Patient ID: Katrina Brooks, female   DOB: 01-31-1976, 44 y.o.   MRN: [id number]   This visit occurred during the SARS-CoV-2 public health emergency.  Safety protocols were in place, including screening questions prior to the visit, additional usage of staff PPE, and extensive cleaning of exam room while observing appropriate contact time as indicated for disinfecting solutions.   HPI: Katrina Brooks is a 44 y.o.-year-old female, referred by her PCP, Dr. Tessa Lerner, for management of DM1, diagnosed as a child (70 y/o), uncontrolled, with complications (peripheral neuropathy) and Hashimoto's hypothyroidism.  She moved from [place] recently.  Reviewed HbA1c levels: No results found for: HGBA1C    Insulin pump:  -since 44 y/o -only had Medtronic pumps -Medtronic 670 G - since 2018 - with clear ring >> will get a replacement  CGM: -Medtronic Guardian  Insulin: -on Novolog -tried Apidra >> did not like it -tried Humalog >> slower onset, was not controlling her sugars very well  Supplies: -prev. Medtronic  -[place] now  Pump settings: - basal rates: 12 am: 0.825 units/h 7 am: 0.750 8:30 am: 0.775 1 pm: 0.825 4 pm:0.975 - ICR: 1:11 - target: 85-140 - ISF: 45 - Insulin on Board: 3h - bolus wizard: on - extended bolusing: not using - changes infusion site: q5.5 days TDD from basal insulin: 59% (24.6 units) TDD from bolus insulin: 41% (17.3 units) Total daily dose: Up to 60 units daily Sensor alarms: 60-230 Meter: Contour Next  CGM parameters: - Average from CGM: 164+/-49 -GMI 7.2% - Average from manual BG checks: 180+/-64 - She checks her blood sugars 6.1 times a day and calibrate the sensor 2.7 times a day  Time in range:  - very low (<54): 0% - low (<70): 0% - normal range (70-180): 64% - high sugars (>180): 31% - very high sugars (>250): 5%   - in auto mode: 79% of the time - in manual mode: 21% of the time    Lowest sugar was 60s; she has hypoglycemia awareness  at 60s.  She has an expired glucagon kit at home. No previous hypoglycemia admission.  Highest sugar was >400 (site pb), OTW 240. No previous DKA admissions.    Pt's meals are: - Breakfast: Coffee, [place] sausage sticks, cottage cheese, fruit; skips when not working - Lunch: [employer] or sandwich, fruit - Dinner: Meat and vegetables or seafood - Snacks: several, various She works 4 days a week, 8 AM to 5:30 PM (at [employer])  - no CKD but had MAU in the past, last BUN/creatinine:  11/02/2019:13/0.91, GFR 78, glucose 159, protein to creatinine ratio in 24-hour urine 0.097 (< OR = 0.114) No results found for: BUN, CREATININE   - + HL; last set of lipids: 11/02/2019: 124/62/66/54 No results found for: CHOL, HDL, LDLCALC, LDLDIRECT, TRIG, CHOLHDL  On Crestor 10 per cardiology. CAC was 0 in 07/2019. On ASA 81.  - last eye exam was in 03/2020. No DR reportedly.   - no numbness and tingling in her feet.  Pt has FH of DM2 in grandfather, great aunt. Metabolic syndrome in mother  Hypothyroidism: - 2/2 Hashimoto's thyroiditis per review of records from previous endocrinologist  Pt is on levothyroxine 125 mcg daily, taken: - in am - fasting - at least 30 min from b'fast - no calcium - no iron - + multivitamins - no PPIs - not on Biotin  TSH levels available for review: 11/02/2019: TSH 1.07 (0.4-4.5), free T4 1.2 (0.8-1.8 No results found for:  TSH  She also has a history of hyperlipidemia.  She is on OCPs for previous irregular menses. She has not decided against having children.  She has a history of HSV1.  In [place], she was working in the [employer].  ROS: Constitutional: + weight gain (25 lbs in last 6 mo), no weight loss, no fatigue, no subjective hyperthermia, no subjective hypothermia, no nocturia Eyes: no blurry vision, no xerophthalmia ENT: no sore throat, no nodules palpated in neck, no dysphagia, no odynophagia, no hoarseness, no tinnitus, no  hypoacusis Cardiovascular: no CP, no SOB, no palpitations, no leg swelling Respiratory: no cough, no SOB, no wheezing Gastrointestinal: no N, no V, no D, no C, no acid reflux Musculoskeletal: no muscle, no joint aches Skin: no rash, no hair loss Neurological: no tremors, no numbness or tingling/no dizziness/no HAs Psychiatric: no depression, no anxiety  Past medical history: + See HPI  Past Surgical History:  Procedure Laterality Date  . BUNIONECTOMY Right 2016  . CYST REMOVAL LEG Bilateral    Bilateral feet-2006 and 2016  . TEAR DUCT PROBING     As a child  . TONSILLECTOMY  2001   Social History   Socioeconomic History  . Marital status: Married    Spouse name: Not on file  . Number of children: 0  . Years of education: Not on file  . Highest education level: Not on file  Occupational History  . Occupation: Therapist, sports at [employer]  Tobacco Use  . Smoking status: Never Smoker  . Smokeless tobacco: Never Used  Substance and Sexual Activity  . Alcohol use: Not Currently  . Drug use: Never  . Sexual activity: Not on file  Other Topics Concern  . Not on file  Social History Narrative  . Not on file   Social Determinants of Health   Financial Resource Strain:   . Difficulty of Paying Living Expenses: Not on file  Food Insecurity:   . Worried About Charity fundraiser in the Last Year: Not on file  . Ran Out of Food in the Last Year: Not on file  Transportation Needs:   . Lack of Transportation (Medical): Not on file  . Lack of Transportation (Non-Medical): Not on file  Physical Activity:   . Days of Exercise per Week: Not on file  . Minutes of Exercise per Session: Not on file  Stress:   . Feeling of Stress : Not on file  Social Connections:   . Frequency of Communication with Friends and Family: Not on file  . Frequency of Social Gatherings with Friends and Family: Not on file  . Attends Religious Services: Not on file  . Active Member of Clubs or Organizations:  Not on file  . Attends Archivist Meetings: Not on file  . Marital Status: Not on file  Intimate Partner Violence:   . Fear of Current or Ex-Partner: Not on file  . Emotionally Abused: Not on file  . Physically Abused: Not on file  . Sexually Abused: Not on file   Current Outpatient Medications on File Prior to Visit  Medication Sig Dispense Refill  . aspirin 81 MG EC tablet daily.    Marland Kitchen escitalopram (LEXAPRO) 20 MG tablet Take 20 mg by mouth daily.    . insulin aspart (NOVOLOG) 100 UNIT/ML injection INJECT SUBCUTANEOUSLY A  MAXIMUM OF 80 UNITS DAILY  VIA INSULIN PUMP    . levothyroxine (SYNTHROID) 125 MCG tablet Take 125 mcg by mouth every morning.    Marland Kitchen  Multiple Vitamin (MULTIVITAMIN ADULT PO) Take by mouth.    . norgestrel-ethinyl estradiol (LO/OVRAL) 0.3-30 MG-MCG tablet daily.    . rosuvastatin (CRESTOR) 10 MG tablet Take 10 mg by mouth daily.    . valACYclovir (VALTREX) 500 MG tablet Take 500 mg by mouth daily as needed.     No current facility-administered medications on file prior to visit.   No Known Allergies   Family History  Problem Relation Age of Onset  . Metabolic syndrome Mother   . Thyroid disease Mother   . Hyperlipidemia Father   . Hypertension Father   . Hypertension Sister     + Jon Gills and great aunt with type 2 diabetes + Grandmother with heart problems + Grandfather with Addison's disease  PE: BP 110/78   Pulse 77   Wt 178 lb 3.2 oz (80.8 kg)   SpO2 97%  Wt Readings from Last 3 Encounters:  06/02/20 178 lb 3.2 oz (80.8 kg)   Constitutional: overweight, in NAD Eyes: PERRLA, EOMI, no exophthalmos ENT: moist mucous membranes, no thyromegaly, no cervical lymphadenopathy Cardiovascular: RRR, No MRG Respiratory: CTA B Gastrointestinal: abdomen soft, NT, ND, BS+ Musculoskeletal: no deformities, strength intact in all 4 Skin: moist, warm, no rashes Neurological: no tremor with outstretched hands, DTR normal in all 4  ASSESSMENT: 1.  DM1, uncontrolled, with long-term complications - PN  2.  Hypothyroidism  PLAN:  1. Patient with long-standing DM1, uncontrolled, on insulin pump therapy. She needs to change endocrinologists as she moved to [place] recently and also, her previous endocrinologist is retiring. At today's visit, HbA1c is 8.1% (higher). -Patient has been on an insulin pump for last 25 years, always using Medtronic pumps. She is in the 3-year of her Medtronic 670 G pump, now trying to change the pump due to the recall of all the problems with a clear ring. She would not want to upgrade to the 770 G insulin pump yet. She is happy with her current pump. CGM interpretation: -At today's visit, we reviewed together her CGM download for the last 2 weeks. Although for the last 3 months the HbA1c is 8.1%, her GMI for the last 2 weeks is actually 7.2%, which shows a decrease in her glucose average by approximately 1 percentage. There is a good improvement and, upon questioning, patient reports improving her diet lately in an effort to lose weight. She does report a weight gain of approximately 25 pounds in the last 6 months when she is trying to lose. -In the last 2 weeks, 64% of her blood sugars are within target range, not quite at our target of more than 70%. He has no lows lower than 54. Reviewing the trends, it appears that her sugars are high after coffee or breakfast, and also high after lunch and dinner. They drop over afterwards, but not to the point of lows. Upon questioning patient is not taking the NovoLog 15 minutes before meals, as recommended, but she is trying to inject her mealtime insulin at the start of the meal. I explained that the lag time between the injection and the meal is ideally 15 minutes, but can be further if the sugars are lower or longer if the sugars are higher. -Also, reviewing individual tracings, it appears that patient needs to enter phantom carbs at night to be able to correct blood sugars.  For now, we will keep the insulin sensitivity factor at the current value but I advised her to decrease her glucose target -Also, it appears that her  sugars increase significantly after coffee even if she does not add any creamer. I advised her that even black coffee will increase blood sugars, so I advised her to also bolus for coffee, by entering 15 to 20 g of carbs into her pump. We will do so for the first couple coffee of the day, but for now we will not bolus for the rest of the coffee that she drinks throughout the day -Also, she is not changing the infusion site frequently now, only doing it every 5.5 days -advised her to do this every 3 to 4 days -For now, we can continue the same basal rates but work on bolusing correctly for meals. She is getting only 41% of her total insulin dose from boluses and we may need to decrease her insulin to carb ratio +/- insulin sensitivity factor at next visit, but for now, will work on improving the timing of her boluses before the meals. - I suggested to: Patient Instructions   Please change: - basal rates: 12 am: 0.825 units/h 7 am: 0.750 8:30 am: 0.775 1 pm: 0.825 4 pm:0.975 - ICR: 1:11 - target: 85-140 >> 85-130 - ISF: 45 - Insulin on Board: 3h  Please do the following approximately 15 minutes before every meal: - Enter carbs (C) - Enter sugars (S) - Start insulin bolus (I)  Try to enter 15-20 g carbs for coffee.  Please change the infusion site every 3-4 days (Wed and Sun).  Please come back for a follow-up appointment in 3 months.  - Strongly advised her to start checking sugars at different times of the day - check at least 4 times a day, rotating checks - given sugar log and advised how to fill it and to bring it at next appt  - given foot care handout and explained the principles  - given instructions for hypoglycemia management "15-15 rule"  - advised for yearly eye exams - sent glucagon kit Rx to pharmacy - advised to get  ketone strips - advised to always have Glu tablets with her - advised for a Med-alert bracelet mentioning "type 1 diabetes mellitus". - given instruction Re: exercising and driving in DM1 (pt instructions) [place] - also, given information about sick day rules - we reviewed her most recent annual labs from 10/2019 together: They are all at goal - Return to clinic in 3 months  2.  Hypothyroidism - most recent TSH available is from 10/2019 and this was normal. - she continues on LT4 125 mcg daily - pt feels good on this dose, without complaints. - we discussed about taking the thyroid hormone every day, with water, >30 minutes before breakfast, separated by >4 hours from acid reflux medications, calcium, iron, multivitamins. Pt. is taking it correctly. - will check thyroid tests at next visit.  Total time spent for the visit: 1 hour, in reviewing her pump downloads, discussing her hypo- and hyper-glycemic episodes, reviewing previous labs brought by patient and existent in the system and pump settings and developing a plan to avoid hypo- and hyper-glycemia. I also made suggestions about improving her diet. We also addressed her hypothyroidism.   Philemon Kingdom, MD PhD [facility]

## 2020-06-02 NOTE — Patient Instructions (Addendum)
Please change: - basal rates: 12 am: 0.825 units/h 7 am: 0.750 8:30 am: 0.775 1 pm: 0.825 4 pm:0.975 - ICR: 1:11 - target: 85-140 >> 85-130 - ISF: 45 - Insulin on Board: 3h  Please do the following approximately 15 minutes before every meal: - Enter carbs (C) - Enter sugars (S) - Start insulin bolus (I)  Try to enter 15-20 g carbs for coffee.  Please change the infusion site every 3-4 days (Wed and Sun).  Please come back for a follow-up appointment in 3 months.  Basic Rules for Patients with Type I Diabetes Mellitus  1. The American Diabetes Association (ADA) recommended targets: - fasting sugar 80-130 - after meal sugar <180 - HbA1C <7%  2. Engage in ?150 min moderate exercise per week  3. Make sure you have ?8h of sleep every night as this helps both blood sugars and your weight.  4. Always keep a sugar log (not only record in your meter) and bring it to all appointments with [place].  5. If you are on a pump, know how to access the settings and to modify the parameters.  6.  Remember, you can always call the number on the back of the pump for emergencies related to the pump.  7. "15-15 rule" for hypoglycemia: if sugars are low, take 15 g of carbs** ("fast sugar" - e.g. 4 glucose tablets, 4 oz orange juice), wait 15 min, then check sugars again. If still <80, repeat. Continue  until your sugars >80, then eat a normal meal.   8. Teach family members and coworkers to inject glucagon. Have a glucagon set at home and one at work. They should call 911 after using the set.  9. If you are on a pump, set "insulin on board" time for 5 hours (if your sugars tend to be higher, can use 4 hours).   10. If you are on a pump, use the "dual wave bolus" setting for high fat foods (e.g. pizza). Start with a setting of 50%-50% (50% instant bolus and 50% prolonged bolus over 3h, for e.g.).    11. If you are on a pump, make sure the basal daily insulin dose is approximately equal (not  larger) to the daily insulin you get from boluses, otherwise you are at risk for hypoglycemia.  12. Check sugar before driving. If <100, correct, and only start driving if sugars rise ?852. Check sugar every hour when on a long drive.  13. Check sugar before exercising. If <100, correct, and only start exercising if sugars rise ?100. Check sugar every hour when on a long exercise routine and 1h after you finished exercising.   If >250, check urine for ketones. If you have moderate-large ketones in urine, do not start exercise. Hydrate yourself with clear liquids and correct the high sugar. Recheck sugars and ketones before attempting to exercise.  Be aware that you might need less insulin when exercising.  *intense, short, exercise bursts can increase your sugars, but  *less intense, longer (>1h), exercise routines can decrease your sugars.  If you are on a pump, you might need to decrease your basal rate by 10% or more (or even disconnect your pump) while you exercise to prevent low sugars. Do not disconnect your pump by more than 3 hours at a time! You also might need to decrease your insulin bolus for the meal prior to your exercise time by 20% or more.  14. Make sure you have a MedAlert bracelet or pendant mentioning "  Type I Diabetes Mellitus". If you have a prior episode of severe hypoglycemia or hypoglycemia unawareness, it should also mention this.  15. Please do not walk barefoot. Inspect your feet for sores/cuts and let us know if you have them.  **E.g. of "fast carbs": ? first choice (15 g):  1 tube glucose gel, GlucoPouch 15, 2 oz glucose liquid ? second choice (15-16 g):  3 or 4 glucose tablets (best taken  with water), 15 Dextrose Bits chewable ? third choice (15-20 g):   cup fruit juice,  cup regular soda, 1 cup skim milk,  1 cup sports drink ? fourth choice (15-20 g):  1 small tube [place] gel (not frosting), 2 tbsp raisins, 1 tbsp table sugar,  candy, jelly beans,  gum drops - check package for carb amount   (adapted from: Juluis Rainier. "Insulin therapy and hypoglycemia" Endocrinol Metab Clin N Am 2012, 41: 57-87)  Sick Day Rules for Diabetes  Think S-K-I-L-L:  Sugars:  - if glucose >200, check every 3h and drink sugar free liquids  - if glucose <200, drink carb-containing liquids and recheck 30 min later  - if glucose high, correct with insulin  - if sugars <60, initiate hypoglycemia management (take 15 g of fast carbs and check sugars in 15 min  - repeat until sugars remain >100).  Ketones:  When to check ketones?  When glucose >300 x2 if on insulin injections (>300 x 1 if on insulin pump). When nausea, vomiting, diarrhea, abdominal pain, headache, fever - even if glucose is normal or low - because in this case, you need both glucose and insulin.    - if you have ketone strips for blood >> if ketones are more or equal than 0.6, need to increase insulin - if you have ketone strips for urine >> if ketones are more or equal than "small", need to increase insulin  Insulin: Never skip long acting insulin, even if not eating!   Urine ketones Blood ketones Extra insulin?  no <0.6 no  small 0.6-1.5 Increase dose by 5%  moderate 1.5-3 Increase dose by 10%  large >3 Increase dose by at least 20%   Liquids: - if glucose >200, check every 3h and drink sugar free liquids  - if glucose <200, small sips of carb-containing liquids (e.g. Ginger ale, Gatorade, juice, etc.)  Let us know!   Call us if: Go to ED if: Call your primary care doctor if:  Sugars >300 for >8h Severe abdominal pain Fever >100F for 24h  Moderate to large  urine ketones or blood ketones >1.5 Difficulty breathing Other chronic diseases flaring up  Vomiting and unable to keep liquids down Signs of dehydration

## 2020-06-02 NOTE — Addendum Note (Signed)
Addended by: Darene Lamer T on: 06/02/2020 01:28 PM   Modules accepted: Orders

## 2020-06-08 ENCOUNTER — Encounter: Payer: Self-pay | Admitting: Internal Medicine

## 2020-06-09 ENCOUNTER — Other Ambulatory Visit (HOSPITAL_COMMUNITY): Payer: Self-pay | Admitting: Internal Medicine

## 2020-06-09 MED ORDER — CONTOUR NEXT TEST VI STRP: ORAL_STRIP | 12 refills | Status: DC

## 2020-06-09 MED ORDER — INSULIN ASPART 100 UNIT/ML ~~LOC~~ SOLN
SUBCUTANEOUS | 3 refills | Status: DC
Start: 2020-06-09 — End: 2020-06-10

## 2020-06-09 MED ORDER — ONETOUCH ULTRASOFT LANCETS MISC
1.0000 | Freq: Every day | 1 refills | Status: DC
Start: 2020-06-09 — End: 2020-06-10

## 2020-06-09 MED FILL — FREESTYLE LANCETS: 90 days supply | Qty: 600 | Fill #0

## 2020-06-10 ENCOUNTER — Telehealth: Payer: Self-pay

## 2020-06-10 ENCOUNTER — Other Ambulatory Visit: Payer: Self-pay | Admitting: Internal Medicine

## 2020-06-10 MED ORDER — INSULIN ASPART 100 UNIT/ML ~~LOC~~ SOLN
SUBCUTANEOUS | 3 refills | Status: DC
Start: 2020-06-10 — End: 2020-06-10

## 2020-06-10 MED ORDER — INSULIN ASPART 100 UNIT/ML ~~LOC~~ SOLN: SUBCUTANEOUS | 3 refills | Status: DC

## 2020-06-10 MED ORDER — CONTOUR NEXT TEST VI STRP
ORAL_STRIP | 12 refills | Status: DC
Start: 2020-06-10 — End: 2020-12-23

## 2020-06-10 MED ORDER — ONETOUCH ULTRASOFT LANCETS MISC
1.0000 | Freq: Every day | 1 refills | Status: DC
Start: 2020-06-10 — End: 2023-07-20

## 2020-06-10 MED FILL — CONTOUR NEXT STRIPS: 90 days supply | Qty: 600 | Fill #0

## 2020-06-10 MED FILL — INSULIN ASPART 100 UNIT/ML: 100 | 87 days supply | Qty: 70 | Fill #0

## 2020-06-10 NOTE — Addendum Note (Signed)
Addended by: Darene Lamer T on: 06/10/2020 07:44 AM   Modules accepted: Orders

## 2020-06-10 NOTE — Telephone Encounter (Signed)
Received information from insurance on Novolog- it was approved from 06/09/2020- 06/08/2021.   Will resubmit to pharmacy

## 2020-06-11 ENCOUNTER — Telehealth: Payer: Self-pay

## 2020-06-11 NOTE — Telephone Encounter (Signed)
Received notification that the Novolog was approved via cover my meds:  The request has been approved. The authorization is effective for a maximum of 12 fills from 06/09/2020 to 06/08/2021, as long as the member is enrolled in their current health plan. The request was approved as submitted. A written notification letter will follow with additional details.

## 2020-06-17 DIAGNOSIS — E109 Type 1 diabetes mellitus without complications: Secondary | ICD-10-CM | POA: Diagnosis not present

## 2020-07-27 MED FILL — ESCITALOPRAM 20 MG TABLET: 20 | 90 days supply | Qty: 90 | Fill #1

## 2020-07-27 MED FILL — LEVOTHYROXINE 125 MCG TABLE: 125 | 90 days supply | Qty: 90 | Fill #1

## 2020-07-27 MED FILL — ROSUVASTATIN CALCIUM 10 MG: 10 | 90 days supply | Qty: 90 | Fill #1

## 2020-07-28 ENCOUNTER — Other Ambulatory Visit (HOSPITAL_COMMUNITY): Payer: Self-pay | Admitting: Family Medicine

## 2020-07-28 MED FILL — ELINEST 0.3-30 MG-MCG TABS: 0.3-30 | 84 days supply | Qty: 84 | Fill #0

## 2020-09-08 ENCOUNTER — Other Ambulatory Visit: Payer: Self-pay

## 2020-09-08 MED FILL — INSULIN ASPART 100 UNIT/ML: 100 | 62 days supply | Qty: 50 | Fill #1

## 2020-09-10 ENCOUNTER — Encounter: Payer: Self-pay | Admitting: Internal Medicine

## 2020-09-10 ENCOUNTER — Ambulatory Visit: Payer: 59 | Admitting: Internal Medicine

## 2020-09-10 ENCOUNTER — Other Ambulatory Visit: Payer: Self-pay

## 2020-09-10 VITALS — BP 120/78 | HR 72 | Ht 64.0 in | Wt 182.8 lb

## 2020-09-10 DIAGNOSIS — E1065 Type 1 diabetes mellitus with hyperglycemia: Secondary | ICD-10-CM | POA: Diagnosis not present

## 2020-09-10 DIAGNOSIS — E038 Other specified hypothyroidism: Secondary | ICD-10-CM | POA: Diagnosis not present

## 2020-09-10 DIAGNOSIS — E063 Autoimmune thyroiditis: Secondary | ICD-10-CM | POA: Diagnosis not present

## 2020-09-10 LAB — LIPID PANEL
Cholesterol: 145 mg/dL (ref 0–200)
HDL: 65.7 mg/dL (ref >39.00)
LDL Cholesterol: 69 mg/dL (ref 0–99)
NonHDL: 79.34
Total CHOL/HDL Ratio: 2
Triglycerides: 53 mg/dL (ref 0.0–149.0)
VLDL: 10.6 mg/dL (ref 0.0–40.0)

## 2020-09-10 LAB — COMPREHENSIVE METABOLIC PANEL
ALT: 15 U/L (ref 0–35)
AST: 17 U/L (ref 0–37)
Albumin: 3.9 g/dL (ref 3.5–5.2)
Alkaline Phosphatase: 59 U/L (ref 39–117)
BUN: 9 mg/dL (ref 6–23)
CO2: 27 mEq/L (ref 19–32)
Calcium: 9 mg/dL (ref 8.4–10.5)
Chloride: 106 mEq/L (ref 96–112)
Creatinine, Ser: 0.72 mg/dL (ref 0.40–1.20)
GFR: 103.02 mL/min (ref >60.00)
Glucose, Bld: 105 mg/dL — ABNORMAL HIGH (ref 70–99)
Potassium: 4.5 mEq/L (ref 3.5–5.1)
Sodium: 136 mEq/L (ref 135–145)
Total Bilirubin: 0.5 mg/dL (ref 0.2–1.2)
Total Protein: 6.4 g/dL (ref 6.0–8.3)

## 2020-09-10 LAB — POCT GLYCOSYLATED HEMOGLOBIN (HGB A1C): Hemoglobin A1C: 7.7 % — AB (ref 4.0–5.6)

## 2020-09-10 LAB — MICROALBUMIN / CREATININE URINE RATIO
Creatinine,U: 148.8 mg/dL
Microalb Creat Ratio: 0.5 mg/g (ref 0.0–30.0)
Microalb, Ur: <0.7 mg/dL (ref 0.0–1.9)

## 2020-09-10 LAB — T4, FREE: Free T4: 0.8 ng/dL (ref 0.60–1.60)

## 2020-09-10 LAB — TSH: TSH: 5.12 u[IU]/mL — ABNORMAL HIGH (ref 0.35–4.50)

## 2020-09-10 NOTE — Patient Instructions (Addendum)
Please continue: - basal rates: 12 am: 0.825 units/h 7 am: 0.750 8:30 am: 0.775 1 pm: 0.825 4 pm:0.975 - ICR: 1:11 >> except 5 pm-12 am: 1:9 - target: 85-130 - ISF: 45 - Insulin on Board: 3h  Please do the following approximately 15 minutes before every meal: - Enter carbs (C) - Enter sugars (S) - Start insulin bolus (I)  Please stop at the lab.  Please come back for a follow-up appointment in 3 months.

## 2020-09-10 NOTE — Progress Notes (Signed)
Patient ID: Katrina Brooks, female   DOB: 22-Jan-1976, 45 y.o.   MRN: [id number]   This visit occurred during the SARS-CoV-2 public health emergency.  Safety protocols were in place, including screening questions prior to the visit, additional usage of staff PPE, and extensive cleaning of exam room while observing appropriate contact time as indicated for disinfecting solutions.   HPI: Katrina Brooks is a 45 y.o.-year-old female, initially referred by her PCP, Dr. Tessa Lerner, returning for follow-up for DM1, diagnosed as a child (45 y/o), uncontrolled, without complications and Hashimoto's hypothyroidism.  She moved from [place].  Reviewed HbA1c levels: Lab Results  Component Value Date   HGBA1C 8.1 (A) 06/02/2020      Insulin pump:  -since 45 y/o -only had Medtronic pumps -Medtronic 670 G - since 2018 - with clear ring >> got a replacement  CGM: -Medtronic Guardian  Insulin: -Currently on NovoLog -tried Apidra >> did not like it -tried Humalog >> slower onset, was not controlling her sugars very well  Supplies: -prev. Medtronic  -[place] now  Pump settings: - basal rates: 12 am: 0.825 units/h 7 am: 0.750 8:30 am: 0.775 1 pm: 0.825 4 pm:0.975 - ICR: 1:11 - target: 85-140 >> 85-130 - ISF: 45 - Insulin on Board: 3 hours - bolus wizard: on - extended bolusing: not using - changes infusion site: q5.5 >> q3.7 days TDD from basal insulin: 59% (24.6 units) >> 52% (22.6 units) TDD from bolus insulin: 41% (17.3 units) >> 48% (20.6 units) Total daily dose: Up to 60 units daily Sensor alarms: 60-230 Meter: Contour Next  CGM parameters: - Average from CGM: 164+/-49  >> 159+/-52 - GMI 7.2% >> 7.1% - Average from manual BG checks: 180+/-64 >> 189+/-977 - She checks her blood sugars 6.1 >> 6.0 times a day and calibrate the sensor 2.7 >> 2.5 times a day  Time in range:  - very low (<54): 0% >> 0% - low (<70): 0% >> 1% - normal range (70-180): 64% >> 72% - high sugars  (>180): 31% >> 20% - very high sugars (>250): 5% >> 7%   - in auto mode: 79% >> 77% of the time - in manual mode: 21% >> 23% of the time    Previously:   Lowest sugar was 60s >> 40s; she has hypoglycemia awareness in the 60s.  She has a glucagon kit at home.  No previous hypoglycemia admission. Highest sugar was >400 (site pb), OTW 240 >> 300s.  No previous DKA admissions.  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 CAC was 0 in 07/2019. On ASA 81.  - last eye exam was in 03/2020: No DR reportedly.   -No numbness and tingling in her feet.  Pt has FH of DM2 in grandfather, great aunt. Metabolic syndrome in mother  Hypothyroidism: -Due to Hashimoto's thyroiditis per review of records from previous endocrinologist  Pt is on levothyroxine 125  mcg daily, taken: - in am - fasting - at least 30 min from b'fast - no calcium - no iron - + multivitamins at night - no PPIs - not on Biotin  Reviewed TSH levels: 11/02/2019: TSH 1.07 (0.4-4.5), free T4 1.2 (0.8-1.8) No results found for: TSH  She is on OCPs for irregular menses.  She has a history of HSV1.  In [place], she was working in the [employer].  She did not start diet and exercise yet, but planning to do so.  ROS: Constitutional: no weight gain/no weight loss, no fatigue, no subjective hyperthermia, no subjective hypothermia Eyes: no blurry  vision, no xerophthalmia ENT: no sore throat, no nodules palpated in neck, no dysphagia, no odynophagia, no hoarseness Cardiovascular: no CP/no SOB/no palpitations/no leg swelling Respiratory: no cough/no SOB/no wheezing Gastrointestinal: no N/no V/no D/no C/no acid reflux Musculoskeletal: no muscle aches/no joint aches Skin: no rashes, no hair loss Neurological: no tremors/no numbness/no tingling/no dizziness  I reviewed pt's medications, allergies, PMH, social hx, family hx, and changes were documented in the history of present illness. Otherwise, unchanged from my initial visit note.  Past Surgical History:  Procedure Laterality Date   BUNIONECTOMY Right 2016   CYST REMOVAL LEG Bilateral    Bilateral feet-2006 and 2016   TEAR DUCT PROBING     As a child   TONSILLECTOMY  2001   Social History   Socioeconomic History   Marital status: Married    Spouse name: Not on file   Number of children: 0   Years of education: Not on file   Highest education level: Not on file  Occupational History   Occupation: Therapist, sports at [employer]   Smoking status: Never Smoker   Smokeless tobacco: Never Used  Substance and Sexual Activity   Alcohol use: Not Currently   Drug use: Never   Sexual activity: Not on file  Other Topics Concern   Not on file  Social History Narrative   Not on file   Social Determinants of Health   Financial Resource Strain: Not on file  Food Insecurity: Not on file  Transportation Needs: Not on file  Physical Activity: Not on file  Stress: Not on file  Social Connections: Not on file  Intimate Partner Violence: Not on file   Current Outpatient Medications on File Prior to Visit  Medication Sig Dispense Refill   aspirin 81 MG EC tablet daily.     escitalopram (LEXAPRO) 20 MG tablet Take 20 mg by mouth daily.     Glucagon 3 MG/DOSE POWD Place 3 mg into the nose once as needed for up to 1 dose. 1 each 11   glucose blood (CONTOUR  NEXT TEST) test strip TESTING BLOOD SUGAR 6 TIMES DAILY 600 each 12   insulin aspart (NOVOLOG) 100 UNIT/ML injection INJECT SUBCUTANEOUSLY A  MAXIMUM OF 80 UNITS DAILY  VIA INSULIN PUMP 30 mL 3   Lancets (ONETOUCH ULTRASOFT) lancets 1 each by Other route daily. To check blood sugars. 600 each 1   levothyroxine (SYNTHROID) 125 MCG tablet Take 125 mcg by mouth every morning.     Multiple Vitamin (MULTIVITAMIN ADULT PO) Take by mouth.     norgestrel-ethinyl estradiol (LO/OVRAL) 0.3-30 MG-MCG tablet daily.     rosuvastatin (CRESTOR) 10 MG tablet Take 10 mg by mouth daily.     valACYclovir (VALTREX) 500 MG tablet Take 500 mg by mouth daily as needed.     No current facility-administered medications on file prior  to visit.   No Known Allergies   Family History  Problem Relation Age of Onset   Metabolic syndrome Mother    Thyroid disease Mother    Hyperlipidemia Father    Hypertension Father    Hypertension Sister     + Jon Gills and great aunt with type 2 diabetes + Grandmother with heart problems + Grandfather with Addison's disease  PE: BP 120/78    Pulse 72    Ht _0  (1.626 m)    Wt 182 lb 12.8 oz (82.9 kg)    SpO2 97%    BMI 31.38 kg/m  Wt Readings from Last 3 Encounters:  09/10/20 182 lb 12.8 oz (82.9 kg)  06/02/20 178 lb 3.2 oz (80.8 kg)   Constitutional: overweight, in NAD Eyes: PERRLA, EOMI, no exophthalmos ENT: moist mucous membranes, no thyromegaly, no cervical lymphadenopathy Cardiovascular: RRR, No MRG Respiratory: CTA B Gastrointestinal: abdomen soft, NT, ND, BS+ Musculoskeletal: no deformities, strength intact in all 4 Skin: moist, warm, no rashes Neurological: no tremor with outstretched hands, DTR normal in all 4  ASSESSMENT: 1. DM1, uncontrolled, without long-term complications, but with hyperglycemia  2.  Hashimoto's Hypothyroidism  PLAN:  1. Patient with longstanding type 1 diabetes, uncontrolled, on insulin pump therapy.  Last year, she  moved to [facility] and establish care with me.  At our last visit, HbA1c was 8.1%, higher. -She has been on insulin pump for the last 25 years, always on Medtronic pumps.  At last visit she was in the 3rd year of her Medtronic 670 G pump but I advised her to change the pump since this had a clear ring.  She was not eager to change to the 770 G insulin pump yet and she was happy with the 670G. -At last visit, reviewing the CGM downloads, it appears that in the previous 2 weeks, her sugars were better, with a GMI of 7.1% at 64% of the blood sugars within target range.  Her sugars were higher after coffee for breakfast and also higher after lunch and dinner.  They were dropping afterwards, but not to the point of lows.  Upon questioning, patient was not taking the NovoLog 15 minutes before meals, but at the start of the meal.  We discussed about the importance of lax time and I advised her to always inject NovoLog 15 minutes before meals.  We also discussed about trying to enter 15 to 20 g of carbs in the plan for coffee.  She tried this but this dropped her blood sugars too low.  She also saw lower blood sugars after coffee if she entered smaller amounts, even 4 to 5 units, so she stopped.  Also, she was introducing fantom carbs at night to be able to correct high blood sugars.  We decreased her target to avoid the need for phantom carbs, but she tells me that she still has to do this occasionally.  I also advised her to try to change the infusion set every 3 to 4 days, not every 5.5 days, and she was doing, to avoid the risk of scar tissue and inconsistent absorption of insulin from the site.  Otherwise, we did not change the regimen. CGM interpretation: -At today's visit, we reviewed her CGM downloads: It appears that 72% of values are in target range (goal >70%), while 27% are higher than 180 (goal <25%), and 1% are lower than 70 (goal <4%).  The calculated average blood sugar is 159 for the last 2 weeks.  The  projected HbA1c for the next 3 months (GMI) is 7.1%. -Reviewing the CGM trends, it appears that her sugars do increase after lunch and especially after 5-6 PM snack (she does not bolus for this) which he usually has while at work, before dinner.  Lately, she has had late dinners and sugars have increased more in the evening.  Upon questioning and also upon review of her pump/CGM downloads, it appears that she is bolusing too late for lunch so I believe that this is the reason why the sugars initially increase after lunch and then they decrease, occasionally even having lower blood sugars in the afternoon.  We discussed about trying to move the lunchtime bolus 15 minutes before each meal. Also, we discussed about the importance of bolusing for a snack, even if this is before dinner.  However, I do not believe that she is getting enough insulin for dinner, so sugars will be quite high, even in the upper 200s and 300s after this meal.  Therefore, I advised her to strengthen her insulin to carb ratio with dinner from 1:11 to 1:9.  -Since she feels that she is dropping her sugars after coffee and sugars are fairly stable in the morning per review of the CGM traces, I advised her to only use 2 g of carbs and only in the days that she is off (she works 12-hour shifts 3 days a week) and less active. -She is planning to increase her physical activity and we discussed about the difference between physical activity and exercise.  She has an elliptical machine at home but in the garage, and she is not using it much.  However, if she does start exercising, we will need to be careful with basal rates and may possibly need to relax her ICRs -We also discussed about impact of different foods on the blood sugars, including high glycemic index meals versus more fatty meals. -At this visit, reviewing her pump downloads and also per discussion with the patient, it appears that she is doing a much better job changing her infusion  sites every 3 to 4 days (Sundays and Wednesdays) -We will check her annual labs at today's visit (fasting) -Of note, her NovoLog PA was approved - I suggested to: Patient Instructions  Please continue: - basal rates: 12 am: 0.825 units/h 7 am: 0.750 8:30 am: 0.775 1 pm: 0.825 4 pm:0.975 - ICR: 1:11 >> except 5 pm-12 am: 1:9 - target: 85-130 - ISF: 45 - Insulin on Board: 3h  Please do the following approximately 15 minutes before every meal: - Enter carbs (C) - Enter sugars (S) - Start insulin bolus (I)  Please stop at the lab.  Please come back for a follow-up appointment in 3 months.  - we checked her HbA1c: 7.7% (better) - advised to check sugars at different times of the day - 4x a day, rotating check times - advised for yearly eye exams >> she is UTD - return to clinic in 3 months  2.  Hypothyroidism - latest thyroid labs reviewed with pt >> normal in 10/2019  - she continues on LT4 125 mcg daily - pt feels good on this dose. - we discussed about taking the thyroid hormone every day, with water, >30 minutes before breakfast, separated by >4 hours from acid reflux medications, calcium, iron, multivitamins. Pt. is taking it correctly. - will check thyroid tests today: TSH and fT4 - If labs are abnormal, she will need to return for repeat TFTs in 1.5 months  Total time spent for the visit: 40 min, in reviewing her pump downloads, discussing her hypo- and hyper-glycemic episodes, reviewing previous labs and pump settings and developing a plan to avoid hypo- and hyper-glycemia.   Component     Latest Ref Rng & Units 09/10/2020  Sodium     135 - 145 mEq/L 136  Potassium     3.5 - 5.1 mEq/L 4.5  Chloride     96 - 112 mEq/L 106  CO2     19 - 32 mEq/L 27  Glucose     70 - 99 mg/dL 105 (H)  BUN     6 - 23 mg/dL 9  Creatinine     0.40 - 1.20 mg/dL 0.72  Total Bilirubin     0.2 - 1.2 mg/dL 0.5  Alkaline Phosphatase     39 - 117 U/L 59  AST     0 - 37 U/L 17  ALT      0 - 35 U/L 15  Total Protein     6.0 - 8.3 g/dL 6.4  Albumin     3.5 - 5.2 g/dL 3.9  GFR     >60.00 mL/min 103.02  Calcium     8.4 - 10.5 mg/dL 9.0  Cholesterol     0 - 200 mg/dL 145  Triglycerides     0.0 - 149.0 mg/dL 53.0  HDL Cholesterol     >39.00 mg/dL 65.70  VLDL     0.0 - 40.0 mg/dL 10.6  LDL (calc)     0 - 99 mg/dL 69  Total CHOL/HDL Ratio      2  NonHDL      79.34  Microalb, Ur     0.0 - 1.9 mg/dL <0.7  Creatinine,U     mg/dL 148.8  MICROALB/CREAT RATIO     0.0 - 30.0 mg/g 0.5  Hemoglobin A1C     4.0 - 5.6 % 7.7 (A)  TSH     0.35 - 4.50 uIU/mL 5.12 (H)  T4,Free(Direct)     0.60 - 1.60 ng/dL 0.80   Labs are at goal with the exception of a slightly high TSH.  Since she had normal TFTs on the same dose in the past I would suggest to continue the same levothyroxine dose and recheck her tests in 1.5 months.  Philemon Kingdom, MD PhD [facility]

## 2020-09-16 ENCOUNTER — Telehealth: Payer: Self-pay | Admitting: Internal Medicine

## 2020-09-16 ENCOUNTER — Encounter: Payer: Self-pay | Admitting: Internal Medicine

## 2020-09-16 NOTE — Telephone Encounter (Signed)
Medtr[employer] called to see if we received their fax on 1/27 for pt's diabetic medical supplies. They ask if Dr could please complete and send to fax # [phone].

## 2020-09-20 NOTE — Telephone Encounter (Signed)
[employer] fax received 09/19/20 3:01:40 pm.

## 2020-09-24 DIAGNOSIS — E109 Type 1 diabetes mellitus without complications: Secondary | ICD-10-CM | POA: Diagnosis not present

## 2020-09-30 ENCOUNTER — Other Ambulatory Visit (HOSPITAL_COMMUNITY): Payer: Self-pay | Admitting: Family Medicine

## 2020-09-30 DIAGNOSIS — E7800 Pure hypercholesterolemia, unspecified: Secondary | ICD-10-CM | POA: Diagnosis not present

## 2020-09-30 DIAGNOSIS — Z9641 Presence of insulin pump (external) (internal): Secondary | ICD-10-CM | POA: Diagnosis not present

## 2020-09-30 DIAGNOSIS — E108 Type 1 diabetes mellitus with unspecified complications: Secondary | ICD-10-CM | POA: Diagnosis not present

## 2020-09-30 DIAGNOSIS — E039 Hypothyroidism, unspecified: Secondary | ICD-10-CM | POA: Diagnosis not present

## 2020-09-30 DIAGNOSIS — Z3041 Encounter for surveillance of contraceptive pills: Secondary | ICD-10-CM | POA: Diagnosis not present

## 2020-09-30 DIAGNOSIS — F324 Major depressive disorder, single episode, in partial remission: Secondary | ICD-10-CM | POA: Diagnosis not present

## 2020-10-20 ENCOUNTER — Ambulatory Visit (HOSPITAL_COMMUNITY): Payer: Self-pay

## 2020-10-22 ENCOUNTER — Other Ambulatory Visit: Payer: 59

## 2020-10-27 ENCOUNTER — Other Ambulatory Visit (HOSPITAL_COMMUNITY): Payer: Self-pay | Admitting: Family Medicine

## 2020-10-27 MED FILL — VALACYCLOVIR HCL 500 MG TAB: 500 | 30 days supply | Qty: 30 | Fill #0

## 2020-10-27 MED FILL — ELINEST 0.3-30 MG-MCG TABS: 0.3-30 | 84 days supply | Qty: 84 | Fill #0

## 2020-10-27 MED FILL — ESCITALOPRAM 20 MG TABLET: 20 | 90 days supply | Qty: 90 | Fill #0

## 2020-10-27 MED FILL — ROSUVASTATIN CALCIUM 10 MG: 10 | 90 days supply | Qty: 90 | Fill #0

## 2020-10-29 ENCOUNTER — Other Ambulatory Visit (INDEPENDENT_AMBULATORY_CARE_PROVIDER_SITE_OTHER): Payer: 59

## 2020-10-29 ENCOUNTER — Other Ambulatory Visit: Payer: Self-pay

## 2020-10-29 DIAGNOSIS — E063 Autoimmune thyroiditis: Secondary | ICD-10-CM

## 2020-10-29 DIAGNOSIS — E038 Other specified hypothyroidism: Secondary | ICD-10-CM

## 2020-10-29 LAB — TSH: TSH: 1.44 u[IU]/mL (ref 0.35–4.50)

## 2020-10-29 LAB — T4, FREE: Free T4: 0.93 ng/dL (ref 0.60–1.60)

## 2020-11-04 ENCOUNTER — Encounter: Payer: Self-pay | Admitting: Internal Medicine

## 2020-11-04 DIAGNOSIS — E063 Autoimmune thyroiditis: Secondary | ICD-10-CM

## 2020-11-04 DIAGNOSIS — E038 Other specified hypothyroidism: Secondary | ICD-10-CM

## 2020-11-05 ENCOUNTER — Other Ambulatory Visit: Payer: Self-pay | Admitting: Internal Medicine

## 2020-11-05 MED ORDER — LEVOTHYROXINE SODIUM 125 MCG PO TABS: 125.0000 ug | ORAL_TABLET | Freq: Every morning | ORAL | 2 refills | Status: DC

## 2020-11-05 MED FILL — LEVOTHYROXINE 125 MCG TAB: 125 | 90 days supply | Qty: 90 | Fill #0

## 2020-11-22 ENCOUNTER — Other Ambulatory Visit: Payer: Self-pay | Admitting: Internal Medicine

## 2020-11-23 MED ORDER — INSULIN ASPART 100 UNIT/ML ~~LOC~~ SOLN
SUBCUTANEOUS | 3 refills | Status: DC
  Filled 2020-11-23: qty 30, 37d supply, fill #0

## 2020-11-24 ENCOUNTER — Other Ambulatory Visit (HOSPITAL_COMMUNITY): Payer: Self-pay

## 2020-11-25 ENCOUNTER — Other Ambulatory Visit: Payer: Self-pay | Admitting: Internal Medicine

## 2020-11-25 ENCOUNTER — Other Ambulatory Visit (HOSPITAL_COMMUNITY): Payer: Self-pay

## 2020-11-25 ENCOUNTER — Encounter: Payer: Self-pay | Admitting: Internal Medicine

## 2020-11-26 ENCOUNTER — Other Ambulatory Visit (HOSPITAL_COMMUNITY): Payer: Self-pay

## 2020-11-26 ENCOUNTER — Other Ambulatory Visit: Payer: Self-pay | Admitting: Internal Medicine

## 2020-11-26 MED ORDER — CONTOUR NEXT TEST VI STRP
ORAL_STRIP | 3 refills | Status: DC
  Filled 2020-11-26: qty 200, 33d supply, fill #0

## 2020-11-26 MED ORDER — CONTOUR NEXT TEST VI STRP
ORAL_STRIP | 3 refills | Status: DC
  Filled 2020-11-26: qty 200, 33d supply, fill #0
  Filled 2021-01-23: qty 200, 34d supply, fill #0
  Filled 2021-01-30: qty 200, 30d supply, fill #0
  Filled 2021-01-30: qty 400, 60d supply, fill #0

## 2020-12-09 NOTE — Telephone Encounter (Signed)
Will close encounter. It shows Rx have been filled.

## 2020-12-15 DIAGNOSIS — E109 Type 1 diabetes mellitus without complications: Secondary | ICD-10-CM | POA: Diagnosis not present

## 2020-12-19 DIAGNOSIS — M7731 Calcaneal spur, right foot: Secondary | ICD-10-CM | POA: Diagnosis not present

## 2020-12-19 DIAGNOSIS — M722 Plantar fascial fibromatosis: Secondary | ICD-10-CM | POA: Diagnosis not present

## 2020-12-23 ENCOUNTER — Other Ambulatory Visit (HOSPITAL_COMMUNITY): Payer: Self-pay

## 2020-12-23 ENCOUNTER — Ambulatory Visit: Payer: 59 | Admitting: Internal Medicine

## 2020-12-23 ENCOUNTER — Encounter: Payer: Self-pay | Admitting: Internal Medicine

## 2020-12-23 ENCOUNTER — Other Ambulatory Visit: Payer: Self-pay

## 2020-12-23 VITALS — BP 120/82 | HR 82 | Ht 64.0 in | Wt 186.4 lb

## 2020-12-23 DIAGNOSIS — E063 Autoimmune thyroiditis: Secondary | ICD-10-CM

## 2020-12-23 DIAGNOSIS — E1065 Type 1 diabetes mellitus with hyperglycemia: Secondary | ICD-10-CM | POA: Diagnosis not present

## 2020-12-23 DIAGNOSIS — E038 Other specified hypothyroidism: Secondary | ICD-10-CM

## 2020-12-23 LAB — POCT GLYCOSYLATED HEMOGLOBIN (HGB A1C): Hemoglobin A1C: 7.5 % — AB (ref 4.0–5.6)

## 2020-12-23 MED ORDER — LYUMJEV 100 UNIT/ML IJ SOLN
INTRAMUSCULAR | 3 refills | Status: DC
  Filled 2020-12-23: qty 20, 30d supply, fill #0

## 2020-12-23 NOTE — Progress Notes (Signed)
Patient ID: Katrina Brooks, female   DOB: January 28, 1976, 45 y.o.   MRN: [id number]   This visit occurred during the SARS-CoV-2 public health emergency.  Safety protocols were in place, including screening questions prior to the visit, additional usage of staff PPE, and extensive cleaning of exam room while observing appropriate contact time as indicated for disinfecting solutions.   HPI: Katrina Brooks is a 45 y.o.-year-old female, initially referred by her PCP, Dr. Tessa Lerner, returning for follow-up for DM1, diagnosed as a child (26 y/o), uncontrolled, without complications and Hashimoto's hypothyroidism.  She moved from [place].  Last visit 3.5 months ago.  Interim history: She has plantar fasciitis (R foot)  - had a steroid inj 3 days ago >> sugars 400-500 for 1 day, then improved.  Pain is better. Otherwise, she mentions weight gain, but no other symptoms.  Reviewed HbA1c levels: Lab Results  Component Value Date   HGBA1C 7.7 (A) 09/10/2020   HGBA1C 8.1 (A) 06/02/2020    Previously:   Insulin pump:  -since 45 y/o -only had Medtronic pumps -Medtronic 670 G - since 2018 - with clear ring >> got a replacement  CGM: -Medtronic Guardian  Insulin: -Currently on NovoLog - [place] approved -tried Apidra >> did not like it -tried Humalog >> slower onset, was not controlling her sugars very well  Supplies: -prev. Medtronic  -[place] now  Pump settings: - basal rates: 12 am: 0.825 units/h 7 am: 0.750 8:30 am: 0.775 1 pm: 0.825 4 pm:0.975 - ICR: 1:11 - target: 85-130 - ISF: 45 - Insulin on Board: 3h - bolus wizard: on - extended bolusing: not using - changes infusion site: q 5.5 >> q3-4 days TDD from basal insulin: 59% (24.6 units) >> 52% (22.6 units) >> 48% (23.2 units) TDD from bolus insulin: 41% (17.3 units) >> 48% (20.6 units) >> 52% (24.8 units) Total daily dose: Up to 60 units daily Sensor alarms: 60-230 Meter: Contour Next  CGM parameters: - Average from CGM:  164+/-49  >> 159+/-52 >> 160+/-50 - GMI 7.2% >> 7.1% >> ? - Average from manual BG checks: 180+/-64 >> 189+/-977 >> 203+/-82 - She checks her blood sugars 6.1 >> 6.0 >> 5.4 times a day and calibrate the sensor 2.7 >> 2.5 >> 2.2 times a day  Time in range:  - very low (<54): 0% >> 0% >> 0% - low (<70): 0% >> 1% >> 1% - normal range (70-180): 64% >> 72% >> 66% - high sugars (>180): 31% >> 20% >> 29% - very high sugars (>250): 5% >> 7% >> 4%   - in auto mode: 79% >> 77% >> 72% of the time - in manual mode: 21% >> 23% >> 28% of the time    Previously:   Previously:   Lowest sugar was 60s >> 40s >> <40 (yardwork); she has hypoglycemia awareness in the 60s.  She has a glucagon kit at home.  No previous hypoglycemia admission. Highest sugar was >400 (site pb), OTW 240 >> 300s >> 500 (steroids).  No previous DKA admissions.  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 CAC was 0 in 07/2019. On ASA 81.  - last eye exam was in 03/2020: No DR reportedly.   - No numbness and tingling in her feet.  Pt has FH of DM2 in grandfather, great aunt. Metabolic syndrome in mother  Hypothyroidism: -Due to Hashimoto's thyroiditis per review of records from previous endocrinologist  Pt is on  levothyroxine 125 mcg daily, taken: - in am - fasting - at least 30 min from b'fast - no calcium - no iron - + multivitamins at night - no PPIs - not on Biotin  Reviewed TSH levels: Lab Results  Component Value Date   TSH 1.44 10/29/2020   TSH 5.12 (H) 09/10/2020  11/02/2019: TSH 1.07 (0.4-4.5), free T4 1.2 (0.8-1.8)  She is on OCPs for irregular menses.  She has a history of HSV1.  In [place], she was working in the [employer].  ROS: Constitutional: + weight gain/no weight loss, no fatigue, no subjective hyperthermia, no subjective hypothermia Eyes: no blurry vision, no xerophthalmia ENT: no sore throat, no nodules palpated in neck, no dysphagia, no odynophagia, no hoarseness Cardiovascular: no CP/no SOB/no palpitations/no leg swelling Respiratory: no cough/no SOB/no wheezing Gastrointestinal: no N/no V/no D/no C/no acid reflux Musculoskeletal: no muscle aches/no joint aches Skin: no rashes, no hair loss Neurological: no tremors/no numbness/no tingling/no dizziness  I reviewed pt's medications, allergies, PMH, social hx, family hx, and changes were documented in the history of present illness. Otherwise, unchanged from my initial visit note.  Past Surgical History:  Procedure Laterality Date  . BUNIONECTOMY Right 2016  . CYST REMOVAL LEG Bilateral    Bilateral feet-2006 and 2016  . TEAR DUCT PROBING     As a child  . TONSILLECTOMY  2001   Social History   Socioeconomic History  . Marital status: Married    Spouse name: Not on file  . Number of children: 0  . Years of education: Not on file  . Highest education level: Not on file  Occupational History  . Occupation: Therapist, sports at [employer]  Tobacco Use  . Smoking status: Never Smoker  . Smokeless tobacco: Never Used  Substance and Sexual Activity  . Alcohol use: Not Currently  . Drug use: Never  . Sexual activity: Not on file  Other Topics Concern  . Not on file  Social History Narrative  . Not on file    Social Determinants of Health   Financial Resource Strain: Not on file  Food Insecurity: Not on file  Transportation Needs: Not on file  Physical Activity: Not on file  Stress: Not on file  Social Connections: Not on file  Intimate Partner Violence: Not on file   Current Outpatient Medications on File Prior to Visit  Medication Sig Dispense Refill  . aspirin 81 MG EC tablet daily.    Marland Kitchen escitalopram (LEXAPRO) 20 MG tablet Take 20 mg by mouth daily.    Marland Kitchen escitalopram (LEXAPRO) 20 MG tablet TAKE 1 TABLET BY MOUTH ONCE A DAY 90 tablet 1  . escitalopram (LEXAPRO) 20 MG tablet TAKE 1 TABLET BY MOUTH ONCE A DAY 90 tablet 1  . escitalopram (LEXAPRO)  20 MG tablet TAKE 1 TABLET BY MOUTH ONCE DAILY 90 tablet 1  . Glucagon 3 MG/DOSE POWD PLACE 3 MG INTO THE NOSE ONCE AS NEEDED FOR UP TO 1 DOSE. 1 each 11  . glucose blood (CONTOUR NEXT TEST) test strip TESTING BLOOD SUGAR 6 TIMES DAILY 600 each 12  . glucose blood (CONTOUR NEXT TEST) test strip USE TO TEST BLOOD SUGAR 6 TIMES DAILY 200 strip 3  . insulin aspart (NOVOLOG) 100 UNIT/ML injection INJECT UP TO A MAXIMUM OF 80 UNITS UNDER THE SKIN DAILY VIA INSULIN PUMP 30 mL 3  . Lancets (FREESTYLE) lancets USE TO CHECK BLOOD SUGAR 6 TIMES DAILY 600 each 1  . Lancets (ONETOUCH ULTRASOFT) lancets 1 each by Other route daily. To check blood sugars. 600 each 1  . levothyroxine (SYNTHROID) 125 MCG tablet TAKE 1 TABLET BY MOUTH EVERY MORNING 90 tablet 2  . levothyroxine (SYNTHROID) 125 MCG tablet TAKE 1 TABLET BY MOUTH EVERY MORNING ON AN EMPTY STOMACH 90 tablet 1  . Multiple Vitamin (MULTIVITAMIN ADULT PO) Take by mouth.    . norgestrel-ethinyl estradiol (LO/OVRAL) 0.3-30 MG-MCG tablet daily.    . norgestrel-ethinyl estradiol (LO/OVRAL) 0.3-30 MG-MCG tablet TAKE 1 TABLET BY MOUTH ONCE A DAY 84 tablet 1  . norgestrel-ethinyl estradiol (LO/OVRAL) 0.3-30 MG-MCG tablet TAKE 1 TABLET BY MOUTH ONCE A DAY 84 tablet 1  . norgestrel-ethinyl estradiol (LO/OVRAL)  0.3-30 MG-MCG tablet TAKE 1 TABLET BY MOUTH ONCE A DAY 84 tablet 0  . rosuvastatin (CRESTOR) 10 MG tablet Take 10 mg by mouth daily.    . rosuvastatin (CRESTOR) 10 MG tablet TAKE 1 TABLET BY MOUTH ONCE A DAY 90 tablet 1  . rosuvastatin (CRESTOR) 10 MG tablet TAKE 1 TABLET BY MOUTH ONCE A DAY 90 tablet 1  . rosuvastatin (CRESTOR) 10 MG tablet TAKE 1 TABLET BY MOUTH ONCE DAILY 90 tablet 1  . valACYclovir (VALTREX) 500 MG tablet Take 500 mg by mouth daily as needed.    . valACYclovir (VALTREX) 500 MG tablet TAKE 1 TABLET BY MOUTH ONCE A DAY AS NEEDED FOR OUTBREAKS 30 tablet 2  . valACYclovir (VALTREX) 500 MG tablet TAKE 1 TABLET BY MOUTH ONCE A DAY AS NEEDED FOR OUTBREAKS 30 tablet 0   No current facility-administered medications on file prior to visit.   No Known Allergies   Family History  Problem Relation Age of Onset  . Metabolic syndrome Mother   . Thyroid disease Mother   . Hyperlipidemia Father   . Hypertension Father   . Hypertension Sister     + Jon Gills and great aunt with type 2 diabetes + Grandmother with heart problems + Grandfather with Addison's disease  PE: BP 120/82 (BP Location: Right Arm, Patient Position: Sitting, Cuff Size: Normal)   Pulse 82   Ht '5\' 4"'  (1.626 m)   Wt 186 lb 6.4 oz (84.6 kg)   SpO2 97%   BMI 32.00 kg/m  Wt Readings from Last 3 Encounters:  12/23/20 186 lb 6.4 oz (84.6 kg)  09/10/20 182 lb 12.8 oz (82.9 kg)  06/02/20 178 lb 3.2 oz (80.8 kg)   Constitutional: overweight, in NAD Eyes: PERRLA, EOMI, no exophthalmos ENT: moist mucous membranes, no thyromegaly, no cervical lymphadenopathy Cardiovascular: RRR, No MRG Respiratory: CTA B Gastrointestinal: abdomen soft, NT, ND, BS+ Musculoskeletal: no deformities, strength intact in all 4, R foot in boot Skin: moist, warm, no rashes Neurological: no tremor with outstretched hands, DTR normal in all 4  ASSESSMENT: 1. DM1, uncontrolled, without long-term  complications, but with  hyperglycemia  2.  Hashimoto's Hypothyroidism  PLAN:  1. Patient with longstanding type 1 diabetes, uncontrolled, on insulin pump therapy.  She has always been on Medtronic pump, for the last 25 years.  We discussed about changing her pump from the 670 G with clearing, since these have been recalled, to a 770G, but she was not eager to do so because she was happy with the 670 G.  She did get her replacement at this pump. -In the past, we discussed about bolusing for coffee, but she had lower blood sugars after 4 to 5 units of NovoLog, so she stopped.  Also, in the past she was introducing phantom carbs at night to be able to correct high blood sugars.  We decreased her target in an effort to avoid the need to enter phantom carbs, but she still occasionally uses these.  We also discussed about changing her infusion site every 3 to 4 days, which she is doing now. -At last visit, sugars were slightly better, but they were still increasing after lunch and especially after the 5-6 PM snack, before dinner, for which she was not bolusing.  I advised her to call us for this even if it is before dinner.  Her sugars after dinner are also high so I advised her to strengthen her insulin to carb ratio from 1:11-1:9 (however, at today's visit, she does not have this entered in the pump - she forgot).  As sugars were high in the afternoon, I also strongly advised her to bolus for lunch 15 minutes prior to this meal.  She is doing better with this, but she still has many meals for which she boluses later.  We again discussed about bolusing for coffee and I advised her to only enter in the form 2 g of carbs for this and only in the days when she is off, to avoid hypoglycemia when she is active at work.  She was also planning to start physical activity.  She has an elliptical machine at home and discussed about impact of exercise on blood sugars.  Since last visit, she did not exercise due to Plantar fasciitis and she also has  a boot on.  However, she has been working in her garden and she had low blood sugars after working outside-needing to get juice. CGM interpretation: -At today's visit, we reviewed her CGM downloads: It appears that 66% of values are in target range (goal >70%), while 33% are higher than 180 (goal <25%), and 1% are lower than 70 (goal <4%).  The calculated average blood sugar is 160.   -Reviewing the CGM trends, her sugars are more fluctuating in the first half of the night and improved after 4 AM -mostly at goal.  They increase slightly after breakfast and they are more variable after lunch.  Upon questioning, she is not able to bolus 15 minutes before lunch and sometimes even if she boluses, sugars still increase.  Sugars are higher after dinner and upon review of her pump downloads, she did not enter the stronger insulin to carb ratio in the pump as suggested at last visit.  We will increase this today.  She has variable dinnertime, sometimes around 6 PM, but sometimes only after 8 PM.  Therefore, sugars are more variable between 3 PM and 12 AM. -At night, she may graze and we did discuss about possibly doing a square wave bolus, but she needs to be out of the automatic mode to do  it, so for now, we will continue with the above plan. -At this visit, we discussed about entering 50% of carbs for the meal before working out or working in the garden to avoid hypoglycemia. -Also, we again discussed about the importance of bolusing 15 minutes before every meal, but I also suggested a faster acting insulin, for example Fiasp for Lyumjev.  Discussed about the fact that Fiasp occasionally can clog the pump tubing, but this was not found to be the case for Lyumjev.  Lyumjev can cause some burning at the infusion site, but I still suggested that she tried it because it gives her more flexibility since she does not have to start the boluses 15 minutes before the meal, but only at the start of the meal.  She agrees to  try it.  I sent 1 vial to her pharmacy. -She plans to improve her diet to lose weight and we discussed that in that case her insulin requirements will decrease. - I suggested to: Patient Instructions  Please continue: - basal rates: 12 am: 0.825 units/h 7 am: 0.750 8:30 am: 0.775 1 pm: 0.825 4 pm:0.975 - ICR: 1:11, except 5 pm-12 am: 1:9 - target: 85-130 - ISF: 45 - Insulin on Board: 3h  Please do the following approximately 15 minutes before every meal if you stay on Novolog but immediately before the meal if you change to Lyumjev: - Enter carbs (C) - Enter sugars (S) - Start insulin bolus (I)  Try to enter 50% of carbs for the meal before working out. If >2h after the meal, may need to decrease the basal rate by 50%.  Please continue Levothyroxine 125 mcg daily.  Take the thyroid hormone every day, with water, at least 30 minutes before breakfast, separated by at least 4 hours from: - acid reflux medications - calcium - iron - multivitamins.  Please come back for a follow-up appointment in 3-4 months.  - we checked her HbA1c: 7.5% (improved) - advised to check sugars at different times of the day - 4x a day, rotating check times - advised for yearly eye exams >> she is UTD - return to clinic in 3-4 months  2.  Hypothyroidism - TSH was slightly high at last visit but we did not change the dose of levothyroxine due to previous normal TFTs. - Indeed, repeat thyroid test showed a normal TSH: Lab Results  Component Value Date   TSH 1.44 10/29/2020   - she continues on LT4 125 mcg daily - pt feels good on this dose. - we discussed about taking the thyroid hormone every day, with water, >30 minutes before breakfast, separated by >4 hours from acid reflux medications, calcium, iron, multivitamins. Pt. is taking it correctly.  Total time spent for the visit: 40 min, in reviewing her pump downloads, discussing her hypo- and hyper-glycemic episodes, reviewing previous labs and  pump settings and developing a plan to avoid hypo- and hyper-glycemia.   Philemon Kingdom, MD PhD [facility] Endocrinology

## 2020-12-23 NOTE — Patient Instructions (Addendum)
Please continue: - basal rates: 12 am: 0.825 units/h 7 am: 0.750 8:30 am: 0.775 1 pm: 0.825 4 pm:0.975 - ICR: 1:11, except 5 pm-12 am: 1:9 - target: 85-130 - ISF: 45 - Insulin on Board: 3h  Please do the following approximately 15 minutes before every meal if you stay on Novolog but immediately before the meal if you change to Lyumjev: - Enter carbs (C) - Enter sugars (S) - Start insulin bolus (I)  Try to enter 50% of carbs for the meal before working out. If >2h after the meal, may need to decrease the basal rate by 50%.  Please continue Levothyroxine 125 mcg daily.  Take the thyroid hormone every day, with water, at least 30 minutes before breakfast, separated by at least 4 hours from: - acid reflux medications - calcium - iron - multivitamins.  Please come back for a follow-up appointment in 3-4 months.

## 2020-12-24 ENCOUNTER — Other Ambulatory Visit (HOSPITAL_COMMUNITY): Payer: Self-pay

## 2020-12-31 ENCOUNTER — Other Ambulatory Visit (HOSPITAL_COMMUNITY): Payer: Self-pay

## 2020-12-31 DIAGNOSIS — M722 Plantar fascial fibromatosis: Secondary | ICD-10-CM | POA: Diagnosis not present

## 2020-12-31 DIAGNOSIS — M71571 Other bursitis, not elsewhere classified, right ankle and foot: Secondary | ICD-10-CM | POA: Diagnosis not present

## 2020-12-31 MED ORDER — METHYLPREDNISOLONE 4 MG PO TBPK
ORAL_TABLET | ORAL | 0 refills | Status: DC
  Filled 2020-12-31: qty 21, 6d supply, fill #0

## 2021-01-08 DIAGNOSIS — M71571 Other bursitis, not elsewhere classified, right ankle and foot: Secondary | ICD-10-CM | POA: Diagnosis not present

## 2021-01-08 DIAGNOSIS — M722 Plantar fascial fibromatosis: Secondary | ICD-10-CM | POA: Diagnosis not present

## 2021-01-23 MED FILL — Levothyroxine Sodium Tab 125 MCG: ORAL | 90 days supply | Qty: 90 | Fill #0 | Status: AC

## 2021-01-23 MED FILL — Rosuvastatin Calcium Tab 10 MG: ORAL | 90 days supply | Qty: 90 | Fill #0 | Status: AC

## 2021-01-24 ENCOUNTER — Other Ambulatory Visit (HOSPITAL_COMMUNITY): Payer: Self-pay

## 2021-01-27 ENCOUNTER — Other Ambulatory Visit (HOSPITAL_COMMUNITY): Payer: Self-pay

## 2021-01-28 ENCOUNTER — Other Ambulatory Visit (HOSPITAL_COMMUNITY): Payer: Self-pay

## 2021-01-29 ENCOUNTER — Other Ambulatory Visit (HOSPITAL_COMMUNITY): Payer: Self-pay

## 2021-01-30 ENCOUNTER — Other Ambulatory Visit (HOSPITAL_COMMUNITY): Payer: Self-pay

## 2021-01-30 MED FILL — Norgestrel & Ethinyl Estradiol Tab 0.3 MG-30 MCG: ORAL | 84 days supply | Qty: 84 | Fill #0 | Status: AC

## 2021-01-30 MED FILL — Escitalopram Oxalate Tab 20 MG (Base Equiv): ORAL | 90 days supply | Qty: 90 | Fill #0 | Status: AC

## 2021-01-31 ENCOUNTER — Other Ambulatory Visit (HOSPITAL_COMMUNITY): Payer: Self-pay

## 2021-02-02 ENCOUNTER — Other Ambulatory Visit (HOSPITAL_COMMUNITY): Payer: Self-pay

## 2021-02-03 DIAGNOSIS — M722 Plantar fascial fibromatosis: Secondary | ICD-10-CM | POA: Diagnosis not present

## 2021-02-05 ENCOUNTER — Other Ambulatory Visit (HOSPITAL_COMMUNITY): Payer: Self-pay

## 2021-02-16 ENCOUNTER — Other Ambulatory Visit (HOSPITAL_COMMUNITY): Payer: Self-pay

## 2021-02-26 ENCOUNTER — Other Ambulatory Visit: Payer: Self-pay | Admitting: Family Medicine

## 2021-02-26 DIAGNOSIS — Z1231 Encounter for screening mammogram for malignant neoplasm of breast: Secondary | ICD-10-CM

## 2021-03-10 DIAGNOSIS — E109 Type 1 diabetes mellitus without complications: Secondary | ICD-10-CM | POA: Diagnosis not present

## 2021-04-06 DIAGNOSIS — Z794 Long term (current) use of insulin: Secondary | ICD-10-CM | POA: Diagnosis not present

## 2021-04-06 DIAGNOSIS — E109 Type 1 diabetes mellitus without complications: Secondary | ICD-10-CM | POA: Diagnosis not present

## 2021-04-06 LAB — HM DIABETES EYE EXAM

## 2021-04-07 ENCOUNTER — Other Ambulatory Visit (HOSPITAL_COMMUNITY): Payer: Self-pay

## 2021-04-07 DIAGNOSIS — Z0000 Encounter for general adult medical examination without abnormal findings: Secondary | ICD-10-CM | POA: Diagnosis not present

## 2021-04-07 DIAGNOSIS — E7800 Pure hypercholesterolemia, unspecified: Secondary | ICD-10-CM | POA: Diagnosis not present

## 2021-04-07 DIAGNOSIS — Z3041 Encounter for surveillance of contraceptive pills: Secondary | ICD-10-CM | POA: Diagnosis not present

## 2021-04-07 DIAGNOSIS — E039 Hypothyroidism, unspecified: Secondary | ICD-10-CM | POA: Diagnosis not present

## 2021-04-07 DIAGNOSIS — Z9641 Presence of insulin pump (external) (internal): Secondary | ICD-10-CM | POA: Diagnosis not present

## 2021-04-07 DIAGNOSIS — E108 Type 1 diabetes mellitus with unspecified complications: Secondary | ICD-10-CM | POA: Diagnosis not present

## 2021-04-07 DIAGNOSIS — F324 Major depressive disorder, single episode, in partial remission: Secondary | ICD-10-CM | POA: Diagnosis not present

## 2021-04-07 MED ORDER — ESCITALOPRAM OXALATE 20 MG PO TABS
ORAL_TABLET | ORAL | 1 refills | Status: DC
  Filled 2021-04-07: qty 90, 90d supply, fill #0
  Filled 2021-07-10: qty 90, 90d supply, fill #1

## 2021-04-07 MED ORDER — ROSUVASTATIN CALCIUM 10 MG PO TABS
ORAL_TABLET | ORAL | 1 refills | Status: DC
  Filled 2021-04-07: qty 90, 90d supply, fill #0
  Filled 2021-07-10: qty 90, 90d supply, fill #1

## 2021-04-07 MED ORDER — ELINEST 0.3-30 MG-MCG PO TABS
ORAL_TABLET | ORAL | 1 refills | Status: DC
  Filled 2021-04-07: qty 84, 84d supply, fill #0
  Filled 2021-07-10: qty 84, 84d supply, fill #1

## 2021-04-08 ENCOUNTER — Other Ambulatory Visit: Payer: Self-pay

## 2021-04-09 ENCOUNTER — Other Ambulatory Visit (HOSPITAL_COMMUNITY): Payer: Self-pay

## 2021-04-13 ENCOUNTER — Other Ambulatory Visit (HOSPITAL_COMMUNITY): Payer: Self-pay

## 2021-04-14 ENCOUNTER — Other Ambulatory Visit (HOSPITAL_COMMUNITY): Payer: Self-pay

## 2021-04-22 ENCOUNTER — Other Ambulatory Visit: Payer: Self-pay

## 2021-04-22 ENCOUNTER — Ambulatory Visit
Admission: RE | Admit: 2021-04-22 | Discharge: 2021-04-22 | Disposition: A | Discharge status: Home / Self Care | Payer: 59 | Source: Ambulatory Visit | Attending: Family Medicine | Admitting: Family Medicine

## 2021-04-22 DIAGNOSIS — Z1231 Encounter for screening mammogram for malignant neoplasm of breast: Secondary | ICD-10-CM

## 2021-04-23 ENCOUNTER — Encounter: Payer: Self-pay | Admitting: Internal Medicine

## 2021-04-30 ENCOUNTER — Encounter: Payer: Self-pay | Admitting: Internal Medicine

## 2021-04-30 ENCOUNTER — Other Ambulatory Visit: Payer: Self-pay

## 2021-04-30 ENCOUNTER — Other Ambulatory Visit (HOSPITAL_COMMUNITY): Payer: Self-pay

## 2021-04-30 ENCOUNTER — Ambulatory Visit: Payer: 59 | Admitting: Internal Medicine

## 2021-04-30 VITALS — BP 110/68 | HR 85 | Ht 64.0 in | Wt 170.0 lb

## 2021-04-30 DIAGNOSIS — E063 Autoimmune thyroiditis: Secondary | ICD-10-CM | POA: Diagnosis not present

## 2021-04-30 DIAGNOSIS — E1065 Type 1 diabetes mellitus with hyperglycemia: Secondary | ICD-10-CM | POA: Diagnosis not present

## 2021-04-30 DIAGNOSIS — E038 Other specified hypothyroidism: Secondary | ICD-10-CM | POA: Diagnosis not present

## 2021-04-30 LAB — POCT GLYCOSYLATED HEMOGLOBIN (HGB A1C): Hemoglobin A1C: 7.7 % — AB (ref 4.0–5.6)

## 2021-04-30 MED ORDER — LYUMJEV 100 UNIT/ML IJ SOLN
INTRAMUSCULAR | 3 refills | Status: DC
  Filled 2021-04-30: qty 70, 90d supply, fill #0
  Filled 2021-08-25: qty 70, 90d supply, fill #1
  Filled 2021-12-01: qty 70, 90d supply, fill #2
  Filled 2022-02-25: qty 70, 90d supply, fill #3

## 2021-04-30 MED ORDER — LEVOTHYROXINE SODIUM 125 MCG PO TABS
ORAL_TABLET | Freq: Every morning | ORAL | 3 refills | Status: DC
  Filled 2021-04-30: qty 90, 90d supply, fill #0
  Filled 2021-08-25: qty 90, 90d supply, fill #1
  Filled 2021-12-01: qty 90, 90d supply, fill #2
  Filled 2022-02-25: qty 90, 90d supply, fill #3

## 2021-04-30 NOTE — Patient Instructions (Addendum)
Please change: - basal rates: 12 am: 0.825 units/h 7 am: 0.750 8:30 am: 0.775 1 pm: 0.825 4 pm:0.975 - ICR: 1:11 except 5 pm-12 am: 1:9 - target: 85-130 >> 95-110 - ISF: 45 - Insulin on Board: 3h  Please return in 4 months.

## 2021-04-30 NOTE — Progress Notes (Signed)
Katrina Brooks, female   DOB: 11-13-1975, 45 y.o.   MRN: [id number]   This visit occurred during the SARS-CoV-2 public health emergency.  Safety protocols were in place, including screening questions prior to the visit, additional usage of staff PPE, and extensive cleaning of exam room while observing appropriate contact time as indicated for disinfecting solutions.   HPI: Katrina Brooks is a 45 y.o.-year-old female, initially referred by her PCP, Dr. Tessa Lerner, returning for follow-up for DM1, diagnosed as a child (77 y/o), uncontrolled, without complications and Hashimoto's hypothyroidism.  She moved from [place].  Last visit 4 months ago.  Interim history: Before last visit she has steroid injections for plantars fasciitis and sugars increased to 400-500s.  No more steroid injections since last visit.  The pain resolved. No increased urination, blurry vision, nausea, chest pain. She lost 20 lbs since last OV. She is using the "Lose it" app and counting calories. She had to put one of her dogs to sleep yesterday as she is very affected by this.  Reviewed HbA1c levels: Lab Results  Component Value Date   HGBA1C 7.5 (A) 12/23/2020   HGBA1C 7.7 (A) 09/10/2020   HGBA1C 8.1 (A) 06/02/2020    Previously:   Insulin pump:  -since 45 y/o -only had Medtronic pumps -Medtronic 670 G - since 2018 - with clear ring >> got a replacement  CGM: -Medtronic Guardian  Insulin: -Previously on NovoLog - [place] approved -tried Apidra >> did not like it -tried Humalog >> slower onset, was not controlling her sugars very well -Now Lyumjev but she also use NovoLog since last visit after she ran out of Lyumjev.  For the last 10 days, she has been back on Lyumjev  Supplies: -prev. [employer]onic  -[place] now  Pump settings: - basal rates: 12 am: 0.825 units/h 7 am: 0.750 8:30 am: 0.775 1 pm: 0.825 4 pm:0.975 - ICR: 1:11 >> except 5 pm-12 am: 1:9 - target: 85-130 - ISF: 45 - Insulin on  Board: 3h - bolus wizard: on - extended bolusing: not using - changes infusion site: q 5.5 >> q3-4 days TDD from basal insulin: 59% (24.6 units) >> 52% (22.6 units) >> 48% (23.2 units) >> 56% (19.9 units) TDD from bolus insulin: 41% (17.3 units) >> 48% (20.6 units) >> 52% (24.8 units) >> 44% (15.5 units) Total daily dose: Up to 80 units daily Sensor alarms: 60-230 Meter: Contour Next  She checks her sugars more than 4 times a day with her CGM:   Previously:    Previously:   Lowest sugar was 40s >> <40 (yardwork) >> 50s; she has hypoglycemia awareness in the 60s.  She has a glucagon kit at home.  No previous hypoglycemia admission. Highest sugar was 300s >> 500 (steroids) >> 300s.  No previous DKA admissions.  Pt's meals are: - Breakfast: Coffee, [place] sausage sticks, cottage cheese, fruit; skips when not working - Lunch: [employer] or sandwich, fruit - Dinner: Meat and vegetables or seafood - Snacks: several, various She works 4 days a week, 8 AM to 5:30 PM (at [employer])  -No CKD but history of MAU, last BUN/creatinine:  Lab Results  Component Value Date   BUN 9 09/10/2020   CREATININE 0.72 09/10/2020  11/02/2019:13/0.91, GFR 78, glucose 159, protein to creatinine ratio in 24-hour urine 0.097 (< OR = 0.114)  -+ HL; last set of lipids: Lab Results  Component Value Date   CHOL 145 09/10/2020   HDL 65.70 09/10/2020  LDLCALC 69 09/10/2020   TRIG 53.0 09/10/2020   CHOLHDL 2 09/10/2020  11/02/2019: 124/62/66/54 On Crestor 10 per cardiology. CAC was 0 in 07/2019. On ASA 81.  - last eye exam was in 03/2021: No DR   - No numbness and tingling in her feet.  Pt has FH of DM2 in grandfather, great aunt. Metabolic syndrome in mother  Hypothyroidism: -Due to Hashimoto's thyroiditis per review of records from previous endocrinologist  Pt is on levothyroxine 125 mcg daily, taken: - in am - fasting - at least 30 min from b'fast - no calcium - no iron - +  multivitamins at night - no PPIs - not on Biotin  Reviewed TSH levels: Lab Results  Component Value Date   TSH 1.44 10/29/2020   TSH 5.12 (H) 09/10/2020  11/02/2019: TSH 1.07 (0.4-4.5), free T4 1.2 (0.8-1.8)  She is on OCPs for irregular menses.  She has a history of HSV1.  In [place], she was working in the [employer].  ROS: + See HPI  I reviewed pt's medications, allergies, PMH, social hx, family hx, and changes were documented in the history of present illness. Otherwise, unchanged from my initial visit note.  Past Surgical History:  Procedure Laterality Date   BUNIONECTOMY Right 2016   CYST REMOVAL LEG Bilateral    Bilateral feet-2006 and 2016   TEAR DUCT PROBING     As a child   TONSILLECTOMY  2001   Social History   Socioeconomic History   Marital status: Married    Spouse name: Not on file   Number of children: 0   Years of education: Not on file   Highest education level: Not on file  Occupational History   Occupation: Therapist, sports at [employer]  Tobacco Use   Smoking status: Never   Smokeless tobacco: Never  Substance and Sexual Activity   Alcohol use: Not Currently   Drug use: Never   Sexual activity: Not on file  Other Topics Concern   Not on file  Social History Narrative   Not on file   Social Determinants of Health   Financial Resource Strain: Not on file  Food Insecurity: Not on file  Transportation Needs: Not on file  Physical Activity: Not on file  Stress: Not on file  Social Connections: Not on file  Intimate Partner Violence: Not on file   Current Outpatient Medications on File Prior to Visit  Medication Sig Dispense Refill   aspirin 81 MG EC tablet daily.     escitalopram (LEXAPRO) 20 MG tablet TAKE 1 TABLET BY MOUTH ONCE A DAY 90 tablet 1   escitalopram (LEXAPRO) 20 MG tablet Take 1 tablet by mouth daily 90 tablet 1   Glucagon 3 MG/DOSE POWD PLACE 3 MG INTO THE NOSE ONCE AS NEEDED FOR UP TO 1 DOSE. 1 each 11   glucose blood (CONTOUR NEXT  TEST) test strip USE TO TEST BLOOD SUGAR 6 TIMES DAILY (Katrina taking differently: USE TO TEST BLOOD SUGAR 6 TIMES DAILY) 200 strip 3   insulin aspart (NOVOLOG) 100 UNIT/ML injection INJECT UP TO A MAXIMUM OF 80 UNITS UNDER THE SKIN DAILY VIA INSULIN PUMP 30 mL 3   Insulin Lispro-aabc (LYUMJEV) 100 UNIT/ML SOLN Inject up to 80 units a day in the insulin pump 10 mL 3   Lancets (FREESTYLE) lancets USE TO CHECK BLOOD SUGAR 6 TIMES DAILY (Katrina taking differently: USE TO CHECK BLOOD SUGAR 6 TIMES DAILY) 600 each 1   Lancets (ONETOUCH ULTRASOFT) lancets 1  each by Other route daily. To check blood sugars. 600 each 1   levothyroxine (SYNTHROID) 125 MCG tablet TAKE 1 TABLET BY MOUTH EVERY MORNING 90 tablet 2   methylPREDNISolone (MEDROL DOSEPAK) 4 MG TBPK tablet Take as directed on package 21 tablet 0   Multiple Vitamin (MULTIVITAMIN ADULT PO) Take by mouth.     norgestrel-ethinyl estradiol (ELINEST) 0.3-30 MG-MCG tablet Take 1 tablet by mouth once a day 84 tablet 1   norgestrel-ethinyl estradiol (LO/OVRAL) 0.3-30 MG-MCG tablet TAKE 1 TABLET BY MOUTH ONCE A DAY 84 tablet 1   norgestrel-ethinyl estradiol (LO/OVRAL) 0.3-30 MG-MCG tablet TAKE 1 TABLET BY MOUTH ONCE A DAY 84 tablet 1   rosuvastatin (CRESTOR) 10 MG tablet TAKE 1 TABLET BY MOUTH ONCE A DAY 90 tablet 1   rosuvastatin (CRESTOR) 10 MG tablet TAKE 1 TABLET BY MOUTH ONCE A DAY 90 tablet 1   rosuvastatin (CRESTOR) 10 MG tablet Take 1 tablet by mouth daily 90 tablet 1   valACYclovir (VALTREX) 500 MG tablet TAKE 1 TABLET BY MOUTH ONCE A DAY AS NEEDED FOR OUTBREAKS 30 tablet 2   No current facility-administered medications on file prior to visit.   No Known Allergies   Family History  Problem Relation Age of Onset   Metabolic syndrome Mother    Thyroid disease Mother    Hyperlipidemia Father    Hypertension Father    Hypertension Sister     + Jon Gills and great aunt with type 2 diabetes + Grandmother with heart problems + Grandfather  with Addison's disease  PE: BP 110/68 (BP Location: Left Arm, Katrina Position: Sitting, Cuff Size: Normal)   Pulse 85   Ht '5\' 4"'  (1.626 m)   Wt 170 lb (77.1 kg)   SpO2 97%   BMI 29.18 kg/m  Wt Readings from Last 3 Encounters:  04/30/21 170 lb (77.1 kg)  12/23/20 186 lb 6.4 oz (84.6 kg)  09/10/20 182 lb 12.8 oz (82.9 kg)   Constitutional: overweight, in NAD Eyes: PERRLA, EOMI, no exophthalmos ENT: moist mucous membranes, no thyromegaly, no cervical lymphadenopathy Cardiovascular: RRR, No MRG Respiratory: CTA B Gastrointestinal: abdomen soft, NT, ND, BS+ Musculoskeletal: no deformities, strength intact in all 4 Skin: moist, warm, no rashes Neurological: no tremor with outstretched hands, DTR normal in all 4  ASSESSMENT: 1. DM1, uncontrolled, without long-term complications, but with hyperglycemia  2.  Hashimoto's Hypothyroidism  PLAN:  1. Katrina with longstanding, type 1 diabetes, uncontrolled, on insulin pump therapy.  She has always been on the Medtronic insulin pump for the last 25 years.  She is still on the 670 G insulin pump and we discussed at last visit about switching to the new 770 G insulin pump.  She did not do so yet. -At last visit, HbA1c was lower, at 7.5%.  At that time, sugars were more fluctuating in the first half of the night and improved after 4 AM.  They were increasing slightly after breakfast, and they were more variable after lunch.  Upon questioning, she was not able to bolus 15 minutes before lunch and sometimes, even if she was bolusing, sugars were still higher postprandially.  Sugars were also high after dinner and upon reviewing her pump downloads, she did not enter the suggested insulin to carb ratio in the pump from the previous visit.  We changed that at last visit.  She was grazing at night and we discussed about possibly doing a square wave bolus but we did talk about the fact that she needed  to be out of the automatic mode to do this.  We also  discussed about the importance of bolusing 15 minutes before a meal but I also sent Lyumjev insulin to her pharmacy in case her insurance covered it because of the flexibility and bolusing-this insulin is injected at the start of the meal.  I also advised at bedtime to enter 50% of carbs for the meal before working out to avoid hypoglycemia after exercise. CGM interpretation: -At today's visit, we reviewed her CGM downloads: It appears that 72% of values are in target range (goal >70%), while 28% are higher than 180 (goal <25%), and 0% are lower than 70 (goal <4%).  The calculated average blood sugar is 155.  The projected HbA1c for the next 3 months (GMI) is 7.0%.  She is staying 80 in the automatic mode of the insulin pump 60% of the time. -Reviewing the CGM trends, it appears that her sugars increase after breakfast and occasionally after the rest of the meals but upon review of her individual tracings, this may be related to bolusing too late for meals or maybe not entering enough carbs into the pump.  She is now counting carbs as she is trying to lose weight.  She was very successful to lose 10 pounds since last visit.  We discussed that this decrease her insulin resistance are usually blood sugars start to improve.  I did advise her to try to stay on Lyumjev (this was sent to the pharmacy) to improve her flexibility with the dosing and always inject at the start of the meal, not after the meal. -At today's visit, I suggested to decrease her target for high blood sugars to 110 to better be able to correct high blood sugars but I advised her that this will only take effect when she is in the manual mode.  With her continuing to lose weight, I do expect that she would start require less and less insulin, so for now I did not suggest a change in the insulin to carb ratios or the basal rates. - I suggested to: Katrina Instructions  Please change: - basal rates: 12 am: 0.825 units/h 7 am: 0.750 8:30 am:  0.775 1 pm: 0.825 4 pm:0.975 - ICR: 1:11 except 5 pm-12 am: 1:9 - target: 85-130 >> 95-110 - ISF: 45 - Insulin on Board: 3h  Please return in 4 months.   - we checked her HbA1c: 7.7% (higher) - advised to check sugars at different times of the day - 4x a day, rotating check times - advised for yearly eye exams >> she is UTD - return to clinic in 4 months  2.  Hypothyroidism - latest thyroid labs reviewed with pt. >> normal: Lab Results  Component Value Date   TSH 1.44 10/29/2020  - she continues on LT4 125 mcg daily - pt feels good on this dose. - we discussed about taking the thyroid hormone every day, with water, >30 minutes before breakfast, separated by >4 hours from acid reflux medications, calcium, iron, multivitamins. Pt. is taking it correctly. - refilled LT4  Philemon Kingdom, MD PhD [facility]

## 2021-04-30 NOTE — Addendum Note (Signed)
Addended by: Kenyon Ana on: 04/30/2021 10:53 AM   Modules accepted: Orders

## 2021-05-01 ENCOUNTER — Other Ambulatory Visit (HOSPITAL_COMMUNITY): Payer: Self-pay

## 2021-06-01 DIAGNOSIS — E109 Type 1 diabetes mellitus without complications: Secondary | ICD-10-CM | POA: Diagnosis not present

## 2021-07-11 ENCOUNTER — Other Ambulatory Visit (HOSPITAL_COMMUNITY): Payer: Self-pay

## 2021-07-13 ENCOUNTER — Other Ambulatory Visit (HOSPITAL_COMMUNITY): Payer: Self-pay

## 2021-07-24 ENCOUNTER — Encounter: Payer: Self-pay | Admitting: Internal Medicine

## 2021-07-29 NOTE — Telephone Encounter (Signed)
Per PT [employer] is still waiting on the documentation to issue New Pump for PT.

## 2021-07-30 NOTE — Telephone Encounter (Signed)
[employer] is only receiving the cover page of the fax. Please refax to:   [phone]          Or [phone]

## 2021-08-12 NOTE — Telephone Encounter (Signed)
Nelci with [employer] called re: Paperwork was faxed to the wrong Fax#. Nelci requests the paperwork be faxed to her directly at Fax# [phone]

## 2021-08-12 NOTE — Telephone Encounter (Signed)
Paperwork faxed to [employer] at [phone].

## 2021-08-12 NOTE — Telephone Encounter (Signed)
Faxed paperwork back to [phone].

## 2021-08-25 ENCOUNTER — Other Ambulatory Visit (HOSPITAL_COMMUNITY): Payer: Self-pay

## 2021-08-26 ENCOUNTER — Other Ambulatory Visit (HOSPITAL_COMMUNITY): Payer: Self-pay

## 2021-08-27 ENCOUNTER — Other Ambulatory Visit (HOSPITAL_COMMUNITY): Payer: Self-pay

## 2021-08-28 DIAGNOSIS — E109 Type 1 diabetes mellitus without complications: Secondary | ICD-10-CM | POA: Diagnosis not present

## 2021-09-01 ENCOUNTER — Ambulatory Visit: Payer: 59 | Admitting: Internal Medicine

## 2021-09-01 ENCOUNTER — Other Ambulatory Visit: Payer: Self-pay

## 2021-09-01 ENCOUNTER — Encounter: Payer: Self-pay | Admitting: Internal Medicine

## 2021-09-01 VITALS — BP 110/70 | HR 79 | Ht 64.0 in | Wt 173.4 lb

## 2021-09-01 DIAGNOSIS — E038 Other specified hypothyroidism: Secondary | ICD-10-CM

## 2021-09-01 DIAGNOSIS — E1065 Type 1 diabetes mellitus with hyperglycemia: Secondary | ICD-10-CM

## 2021-09-01 DIAGNOSIS — E063 Autoimmune thyroiditis: Secondary | ICD-10-CM | POA: Diagnosis not present

## 2021-09-01 LAB — LIPID PANEL
Cholesterol: 129 mg/dL (ref 0–200)
HDL: 58.5 mg/dL (ref >39.00)
LDL Cholesterol: 56 mg/dL (ref 0–99)
NonHDL: 70.21
Total CHOL/HDL Ratio: 2
Triglycerides: 72 mg/dL (ref 0.0–149.0)
VLDL: 14.4 mg/dL (ref 0.0–40.0)

## 2021-09-01 LAB — POCT GLYCOSYLATED HEMOGLOBIN (HGB A1C): Hemoglobin A1C: 7.3 % — AB (ref 4.0–5.6)

## 2021-09-01 LAB — COMPREHENSIVE METABOLIC PANEL
ALT: 13 U/L (ref 0–35)
AST: 18 U/L (ref 0–37)
Albumin: 4.1 g/dL (ref 3.5–5.2)
Alkaline Phosphatase: 67 U/L (ref 39–117)
BUN: 9 mg/dL (ref 6–23)
CO2: 28 mEq/L (ref 19–32)
Calcium: 9.3 mg/dL (ref 8.4–10.5)
Chloride: 103 mEq/L (ref 96–112)
Creatinine, Ser: 0.72 mg/dL (ref 0.40–1.20)
GFR: 102.31 mL/min (ref >60.00)
Glucose, Bld: 153 mg/dL — ABNORMAL HIGH (ref 70–99)
Potassium: 4.9 mEq/L (ref 3.5–5.1)
Sodium: 138 mEq/L (ref 135–145)
Total Bilirubin: 0.7 mg/dL (ref 0.2–1.2)
Total Protein: 6.2 g/dL (ref 6.0–8.3)

## 2021-09-01 LAB — MICROALBUMIN / CREATININE URINE RATIO
Creatinine,U: 61.9 mg/dL
Microalb Creat Ratio: 1.1 mg/g (ref 0.0–30.0)
Microalb, Ur: <0.7 mg/dL (ref 0.0–1.9)

## 2021-09-01 LAB — TSH: TSH: 1.06 u[IU]/mL (ref 0.35–5.50)

## 2021-09-01 MED ORDER — ACCU-CHEK GUIDE VI STRP: ORAL_STRIP | 3 refills | Status: DC

## 2021-09-01 NOTE — Patient Instructions (Signed)
Please change: - basal rates: 12 am: 0.825 units/h 7 am: 0.750 8:30 am: 0.775 1 pm: 0.825 4 pm:0.975 - ICR: 1:11 except 5 pm-12 am: 1:9 - target: 95-110 - ISF: 45 - Insulin on Board: 3h  Please return in 4 months.

## 2021-09-01 NOTE — Progress Notes (Signed)
Patient ID: Katrina Brooks, female   DOB: 20-Apr-1976, 47 y.o.   MRN: [id number]   This visit occurred during the SARS-CoV-2 public health emergency.  Safety protocols were in place, including screening questions prior to the visit, additional usage of staff PPE, and extensive cleaning of exam room while observing appropriate contact time as indicated for disinfecting solutions.   HPI: Katrina Brooks is a 46 y.o.-year-old female, initially referred by her PCP, Dr. Tessa Lerner, returning for follow-up for DM1, diagnosed as a child (44 y/o), uncontrolled, without complications and Hashimoto's hypothyroidism.  She moved from [place].  Last visit 4 months ago.  Interim history: No increased urination, blurry vision, nausea, chest pain. Before last visit, she lost 20 lbs - using the "Lose it" app and counting calories.  During the holidays, she stopped using the app as she feels that she gained some of the weight back (per our scale, only 3 pounds).  She is planning to restart using the app.  Reviewed HbA1c levels: Lab Results  Component Value Date   HGBA1C 7.7 (A) 04/30/2021   HGBA1C 7.5 (A) 12/23/2020   HGBA1C 7.7 (A) 09/10/2020   HGBA1C 8.1 (A) 06/02/2020    Previously:   Insulin pump:  -since 46 y/o -only had Medtronic pumps -Medtronic 670 G - since 2018 - with clear ring >> got a replacement  CGM: -Medtronic Guardian  Insulin: -Previously on NovoLog - [place] approved -tried Apidra >> did not like it -tried Humalog >> slower onset, was not controlling her sugars very well -Now Lyumjev  Supplies: -prev. Medtronic  -[place] now  Pump settings: - basal rates: 12 am: 0.825 units/h 7 am: 0.750 8:30 am: 0.775 1 pm: 0.825 4 pm:0.975 - ICR: 1:11 except 5 pm-12 am: 1:9 - target: 85-130 >> 95-110 - ISF: 45 - Insulin on Board: 3h - bolus wizard: on - extended bolusing: not using - changes infusion site: q 5.5 >> q3-4 days TDD from basal insulin: 48% (23.2 units) >> 56% (19.9  units) >> 49% TDD from bolus insulin: 52% (24.8 units) >> 44% (15.5 units) >> 51% Total daily dose: Up to 70 units daily Sensor alarms: 60-230 Meter: Contour Next  She checks her sugars more than 4 times a day with her CGM:   Previously:   Previously:    Lowest sugar was <40 (yardwork) >> 50s >> 50s; she has hypoglycemia awareness in the 60s.  She has a glucagon kit at home.  No previous hypoglycemia admission. Highest sugar was 500 (steroids for Planter fasciitis) >> 300s >> 300x 1 during the Holidays (forgot bolus).  No previous DKA admissions.  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 CAC was 0 in 07/2019. On  ASA 81.  - last eye exam was in 03/2021: No DR   - No numbness and tingling in her feet.  Pt has FH of DM2 in grandfather, great aunt. Metabolic syndrome in mother  Hypothyroidism: -Due to Hashimoto's thyroiditis per review of records from previous endocrinologist  Pt is on levothyroxine 125 mcg daily, taken: - in am - fasting - at least 30 min from b'fast - no calcium - no iron - + multivitamins at night - no PPIs - not on Biotin  Reviewed TSH levels: Lab Results  Component Value Date   TSH 1.44 10/29/2020   TSH 5.12 (H)  09/10/2020  11/02/2019: TSH 1.07 (0.4-4.5), free T4 1.2 (0.8-1.8)  She is on OCPs for irregular menses.  In [place], she was working in the [employer].  ROS: + See HPI  I reviewed pt's medications, allergies, PMH, social hx, family hx, and changes were documented in the history of present illness. Otherwise, unchanged from my initial visit note.  Past Surgical History:  Procedure Laterality Date   BUNIONECTOMY Right 2016   CYST REMOVAL LEG Bilateral    Bilateral feet-2006 and 2016   TEAR DUCT PROBING     As a child   TONSILLECTOMY  2001   Social History   Socioeconomic History   Marital status: Married    Spouse name: Not on file   Number of children: 0   Years of education: Not on file   Highest education level: Not on file  Occupational History   Occupation: Therapist, sports at [employer]  Tobacco Use   Smoking status: Never   Smokeless tobacco: Never  Substance and Sexual Activity   Alcohol use: Not Currently   Drug use: Never   Sexual activity: Not on file  Other Topics Concern   Not on file  Social History Narrative   Not on file   Social Determinants of Health   Financial Resource Strain: Not on file  Food Insecurity: Not on file  Transportation Needs: Not on file  Physical Activity: Not on file  Stress: Not on file  Social Connections: Not on file  Intimate Partner Violence: Not on file   Current Outpatient Medications on File Prior to Visit  Medication Sig Dispense Refill   aspirin 81 MG EC tablet daily.     escitalopram (LEXAPRO) 20 MG tablet Take 1 tablet by mouth daily 90 tablet 1   glucose blood (CONTOUR NEXT TEST) test strip USE TO TEST BLOOD SUGAR 6 TIMES DAILY (Patient taking differently: USE TO TEST BLOOD SUGAR 6 TIMES DAILY) 200 strip 3   Insulin Lispro-aabc (LYUMJEV) 100 UNIT/ML SOLN Inject up to 80 units a day in the insulin pump 70 mL 3   Lancets (ONETOUCH ULTRASOFT) lancets 1 each by Other route daily. To check blood sugars. 600 each 1    levothyroxine (SYNTHROID) 125 MCG tablet TAKE 1 TABLET BY MOUTH EVERY MORNING 90 tablet 3   methylPREDNISolone (MEDROL DOSEPAK) 4 MG TBPK tablet Take as directed on package 21 tablet 0   Multiple Vitamin (MULTIVITAMIN ADULT PO) Take by mouth.     norgestrel-ethinyl estradiol (ELINEST) 0.3-30 MG-MCG tablet Take 1 tablet by mouth once a day 84 tablet 1   rosuvastatin (CRESTOR) 10 MG tablet Take 1 tablet by mouth daily 90 tablet 1   valACYclovir (VALTREX) 500 MG tablet TAKE 1 TABLET BY MOUTH ONCE A DAY AS NEEDED FOR OUTBREAKS 30 tablet 2   No current facility-administered medications on file prior to visit.  No Known Allergies   Family History  Problem Relation Age of Onset   Metabolic syndrome Mother    Thyroid disease Mother    Hyperlipidemia Father    Hypertension Father    Hypertension Sister     + Jon Gills and great aunt with type 2 diabetes + Grandmother with heart problems + Grandfather with Addison's disease  PE: BP 110/70 (BP Location: Left Arm, Patient Position: Sitting, Cuff Size: Normal)    Pulse 79    Ht _0  (1.626 m)    Wt 173 lb 6.4 oz (78.7 kg)    SpO2 97%    BMI 29.76 kg/m  Wt Readings from Last 3 Encounters:  09/01/21 173 lb 6.4 oz (78.7 kg)  04/30/21 170 lb (77.1 kg)  12/23/20 186 lb 6.4 oz (84.6 kg)   Constitutional: overweight, in NAD Eyes: PERRLA, EOMI, no exophthalmos ENT: moist mucous membranes, no thyromegaly, no cervical lymphadenopathy Cardiovascular: RRR, No MRG Respiratory: CTA B Musculoskeletal: no deformities, strength intact in all 4 Skin: moist, warm, no rashes Neurological: no tremor with outstretched hands, DTR normal in all 4  ASSESSMENT: 1. DM1, uncontrolled, without long-term complications, but with hyperglycemia  2.  Hashimoto's Hypothyroidism  PLAN:  1. Patient with longstanding, type 1 diabetes, on insulin pump therapy.  She has always been on the Medtronic insulin pump -for the last 25 years.  At last visit she was still on  the 670 G pump but we discussed about switching to the new 770 G insulin pump. -At last visit, sugars were increasing after breakfast and occasionally after the rest of the meals, but upon reviewing individual CGM tracings, this may have been related to bolusing too late for meals or maybe not entering enough carbs into the pump.  She was counting carbs at that time as she was trying to lose weight.  She was very successful in losing 10 pounds before last visit.  We discussed that this may improve her insulin sensitivity and she may not need as much insulin as before.  We continued Lyumjev and discussed that these needed to be injected at the start of the meal, rather than 15 minutes before.  At last visit I also suggested to decrease the target for hyperglycemia correction to 110 she was grazing at night and we discussed about possibly doing a square wave bolus but we did talk about the fact that she needed to be out of the automatic mode to do this.  CGM interpretation: -At today's visit, we reviewed her CGM downloads: It appears that 82% of values are in target range (goal >70%), while 80% are higher than 180 (goal <25%), and 0% are lower than 70 (goal <4%).  The calculated average blood sugar is 146.  The projected HbA1c for the next 3 months (GMI) is 6.8%. -Reviewing the CGM trends, sugars have been better throughout the day, with less variability.  She still has some variability after lunch and dinner.  She also describes that sugars can occasionally go higher after coffee in the morning.  Upon questioning, she is not always bolusing for coffee when the sugars are at goal before drinking it and we discussed that she may need to bolus if she plans to be less active after she drinks her coffee.  If she is going to work, she may not need to bolus for coffee if sugars are lower than 90. -She had more fluctuation in her blood sugars when she was off the sensor recently, but this happens  infrequently.  -At  today's visit, she tells me that she will receive her Medtronic 770 insulin pump today.  At that time, she will probably need the Accu-Chek guide strips sent to her pharmacy.  For now, I put them on her medication list but did not send them to the pharmacy. - I suggested to: Patient Instructions  Please change: - basal rates: 12 am: 0.825 units/h 7 am: 0.750 8:30 am: 0.775 1 pm: 0.825 4 pm:0.975 - ICR: 1:11 except 5 pm-12 am: 1:9 - target: 95-110 - ISF: 45 - Insulin on Board: 3h  Please return in 4 months.   - we checked her HbA1c: 7.3% (lower) - advised to check sugars at different times of the day - 4x a day, rotating check times - advised for yearly eye exams >> she is UTD - will check annual labs today - return to clinic in 4 months  2.  Hypothyroidism - latest thyroid labs reviewed with pt. >> normal: Lab Results  Component Value Date   TSH 1.44 10/29/2020  - she continues on LT4 125 mcg daily - pt feels good on this dose. - we discussed about taking the thyroid hormone every day, with water, >30 minutes before breakfast, separated by >4 hours from acid reflux medications, calcium, iron, multivitamins. Pt. is taking it correctly. - will check thyroid tests today: TSH  - If labs are abnormal, she will need to return for repeat TFTs in 1.5 months  Component     Latest Ref Rng & Units 09/01/2021  Sodium     135 - 145 mEq/L 138  Potassium     3.5 - 5.1 mEq/L 4.9  Chloride     96 - 112 mEq/L 103  CO2     19 - 32 mEq/L 28  Glucose     70 - 99 mg/dL 153 (H)  BUN     6 - 23 mg/dL 9  Creatinine     0.40 - 1.20 mg/dL 0.72  Total Bilirubin     0.2 - 1.2 mg/dL 0.7  Alkaline Phosphatase     39 - 117 U/L 67  AST     0 - 37 U/L 18  ALT     0 - 35 U/L 13  Total Protein     6.0 - 8.3 g/dL 6.2  Albumin     3.5 - 5.2 g/dL 4.1  GFR     >60.00 mL/min 102.31  Calcium     8.4 - 10.5 mg/dL 9.3  Cholesterol     0 - 200 mg/dL 129  Triglycerides     0.0 - 149.0 mg/dL  72.0  HDL Cholesterol     >39.00 mg/dL 58.50  VLDL     0.0 - 40.0 mg/dL 14.4  LDL (calc)     0 - 99 mg/dL 56  Total CHOL/HDL Ratio      2  NonHDL      70.21  Microalb, Ur     0.0 - 1.9 mg/dL <0.7  Creatinine,U     mg/dL 61.9  MICROALB/CREAT RATIO     0.0 - 30.0 mg/g 1.1  TSH     0.35 - 5.50 uIU/mL 1.06  Labs are all at goal.  Philemon Kingdom, MD PhD [facility] Endocrinology

## 2021-09-02 ENCOUNTER — Other Ambulatory Visit (HOSPITAL_COMMUNITY): Payer: Self-pay

## 2021-09-02 ENCOUNTER — Encounter: Payer: Self-pay | Admitting: Internal Medicine

## 2021-09-02 DIAGNOSIS — E1065 Type 1 diabetes mellitus with hyperglycemia: Secondary | ICD-10-CM

## 2021-09-02 MED ORDER — ACCU-CHEK GUIDE VI STRP
ORAL_STRIP | 3 refills | Status: DC
  Filled 2021-09-02: qty 300, 90d supply, fill #0
  Filled 2021-12-01: qty 300, 90d supply, fill #1
  Filled 2022-02-25: qty 200, 67d supply, fill #2

## 2021-09-03 ENCOUNTER — Other Ambulatory Visit (HOSPITAL_COMMUNITY): Payer: Self-pay

## 2021-09-04 ENCOUNTER — Other Ambulatory Visit (HOSPITAL_COMMUNITY): Payer: Self-pay

## 2021-09-07 ENCOUNTER — Other Ambulatory Visit (HOSPITAL_COMMUNITY): Payer: Self-pay

## 2021-09-24 ENCOUNTER — Other Ambulatory Visit (HOSPITAL_COMMUNITY): Payer: Self-pay

## 2021-10-13 DIAGNOSIS — E1065 Type 1 diabetes mellitus with hyperglycemia: Secondary | ICD-10-CM | POA: Diagnosis not present

## 2021-10-13 DIAGNOSIS — E109 Type 1 diabetes mellitus without complications: Secondary | ICD-10-CM | POA: Diagnosis not present

## 2021-10-25 ENCOUNTER — Other Ambulatory Visit (HOSPITAL_COMMUNITY): Payer: Self-pay

## 2021-10-26 ENCOUNTER — Other Ambulatory Visit (HOSPITAL_COMMUNITY): Payer: Self-pay

## 2021-10-26 MED ORDER — ELINEST 0.3-30 MG-MCG PO TABS
1.0000 | ORAL_TABLET | Freq: Every day | ORAL | 1 refills | Status: DC
  Filled 2021-10-26: qty 84, 84d supply, fill #0
  Filled 2022-01-20: qty 84, 84d supply, fill #1

## 2021-10-26 MED ORDER — ESCITALOPRAM OXALATE 20 MG PO TABS
20.0000 mg | ORAL_TABLET | Freq: Every day | ORAL | 1 refills | Status: DC
  Filled 2021-10-26: qty 90, 90d supply, fill #0
  Filled 2022-01-20: qty 90, 90d supply, fill #1

## 2021-10-26 MED ORDER — ROSUVASTATIN CALCIUM 10 MG PO TABS
10.0000 mg | ORAL_TABLET | Freq: Every day | ORAL | 1 refills | Status: DC
  Filled 2021-10-26: qty 90, 90d supply, fill #0
  Filled 2022-01-20: qty 90, 90d supply, fill #1

## 2021-10-27 ENCOUNTER — Other Ambulatory Visit (HOSPITAL_COMMUNITY): Payer: Self-pay

## 2021-10-27 MED ORDER — ESCITALOPRAM OXALATE 20 MG PO TABS
ORAL_TABLET | ORAL | 1 refills | Status: DC
  Filled 2021-10-27: qty 90, 90d supply, fill #0

## 2021-12-02 ENCOUNTER — Other Ambulatory Visit (HOSPITAL_COMMUNITY): Payer: Self-pay

## 2021-12-03 ENCOUNTER — Other Ambulatory Visit (HOSPITAL_COMMUNITY): Payer: Self-pay

## 2021-12-31 ENCOUNTER — Ambulatory Visit: Payer: 59 | Admitting: Internal Medicine

## 2021-12-31 ENCOUNTER — Encounter: Payer: Self-pay | Admitting: Internal Medicine

## 2021-12-31 ENCOUNTER — Other Ambulatory Visit (HOSPITAL_COMMUNITY): Payer: Self-pay

## 2021-12-31 VITALS — BP 120/80 | HR 70 | Ht 64.0 in | Wt 165.8 lb

## 2021-12-31 DIAGNOSIS — E1065 Type 1 diabetes mellitus with hyperglycemia: Secondary | ICD-10-CM | POA: Diagnosis not present

## 2021-12-31 DIAGNOSIS — E038 Other specified hypothyroidism: Secondary | ICD-10-CM | POA: Diagnosis not present

## 2021-12-31 DIAGNOSIS — E063 Autoimmune thyroiditis: Secondary | ICD-10-CM | POA: Diagnosis not present

## 2021-12-31 LAB — POCT GLYCOSYLATED HEMOGLOBIN (HGB A1C): Hemoglobin A1C: 7.8 % — AB (ref 4.0–5.6)

## 2021-12-31 MED ORDER — GLUCAGON 3 MG/DOSE NA POWD
3.0000 mg | Freq: Once | NASAL | 11 refills | Status: DC | PRN
  Filled 2021-12-31: qty 1, 1d supply, fill #0

## 2021-12-31 NOTE — Patient Instructions (Addendum)
Please change: - basal rates: 12 am: 0.825 units/h 7 am: 0.750 8:30 am: 0.775 1 pm: 0.825 4 pm:0.975 - ICR: 1:11 except 5 pm-12 am: 1:9 - target: 95-110 - ISF: 45 - Insulin on Board: 3h  Try to always start the boluses at the beginning of the meal.  If sugars remain high, may need to change the ICR to 1:9 with b'fast and lunch and 1:8 before dinner.  Please return in 4 months.

## 2021-12-31 NOTE — Progress Notes (Signed)
Patient ID: Katrina Brooks, female   DOB: [birth year], 46 y.o.   MRN: [id number]   This visit occurred during the SARS-CoV-2 public health emergency.  Safety protocols were in place, including screening questions prior to the visit, additional usage of staff PPE, and extensive cleaning of exam room while observing appropriate contact time as indicated for disinfecting solutions.   HPI: Katrina Brooks is a 46 y.o.-year-old female, initially referred by her PCP, Dr. Tessa Lerner, returning for follow-up for DM1, diagnosed as a child (54 y/o), uncontrolled, without complications and Hashimoto's hypothyroidism.  She moved from [place].  Last visit 4 months ago.  Interim history: No increased urination, blurry vision, nausea, chest pain. She previously lost 20 pounds (last year) using the "Lose it" app and counting calories.  Since last visit she lost 7 pounds.  Reviewed HbA1c levels: Lab Results  Component Value Date   HGBA1C 7.3 (A) 09/01/2021   HGBA1C 7.7 (A) 04/30/2021   HGBA1C 7.5 (A) 12/23/2020   HGBA1C 7.7 (A) 09/10/2020   HGBA1C 8.1 (A) 06/02/2020    Previously:   Insulin pump:  -since 46 y/o -only had Medtronic pumps -Medtronic 670 G - since 2018 - with clear ring >> got a replacement -Medtronic 770G - since 01-09/2021  CGM: -Medtronic Guardian  Insulin: -Previously on NovoLog - [place] approved -tried Apidra >> did not like it -tried Humalog >> slower onset, was not controlling her sugars very well -Now Lyumjev  Supplies: -prev. Medtronic  -[place] now  Pump settings: - basal rates: 12 am: 0.825 units/h 7 am: 0.750 8:30 am: 0.775 1 pm: 0.825 4 pm:0.975 - ICR: 1:11 except 5 pm-12 am: 1:9 - target: 85-130 >> 95-110 - ISF: 45 - Insulin on Board: 3h - bolus wizard: on - extended bolusing: not using - changes infusion site: q 5.5 >> q3-4 days TDD from basal insulin: 56% (19.9 units) >> 49% >> 63% TDD from bolus insulin: 44% (15.5 units) >> 51% >> 37% Total daily  dose: Up to 70 units daily Sensor alarms: 60-230 Meter: Contour Next  She checks her sugars more than 4 times a day with her CGM:   Previously:   Previously:   Lowest sugar was <40 (yardwork) >> 50s >> 50s >> 60; she has hypoglycemia awareness in the 60s.  She has a glucagon kit at home.  No previous hypoglycemia admission. Highest sugar was 500 (steroids for Planter fasciitis) >> .Marland KitchenMarland Kitchen300x 1 during the Holidays (forgot bolus) >> 300s (after correction of a low).  No previous DKA admissions.  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 CAC was 0 in 07/2019. On ASA 81.  - last  eye exam was in 03/2021: No DR   - No numbness and tingling in her feet.  Pt has FH of DM2 in grandfather, great aunt. Metabolic syndrome in mother.  Hypothyroidism: -Due to Hashimoto's thyroiditis per review of records from previous endocrinologist  Pt is on levothyroxine 125 mcg daily, taken: - in am - fasting - at least 30 min from b'fast - no calcium - no iron - + multivitamins at night - no PPIs - not on Biotin  Reviewed TSH levels: Lab Results  Component Value Date   TSH 1.06 09/01/2021   TSH 1.44 10/29/2020   TSH  5.12 (H) 09/10/2020  11/02/2019: TSH 1.07 (0.4-4.5), free T4 1.2 (0.8-1.8)  She is on OCPs for irregular menses.  In [place], she was working in the [employer].  ROS: + See HPI  I reviewed pt's medications, allergies, PMH, social hx, family hx, and changes were documented in the history of present illness. Otherwise, unchanged from my initial visit note.  Past Surgical History:  Procedure Laterality Date   BUNIONECTOMY Right 2016   CYST REMOVAL LEG Bilateral    Bilateral feet-2006 and 2016   TEAR DUCT PROBING     As a child   TONSILLECTOMY  2001   Social History   Socioeconomic History   Marital status: Married    Spouse name: Not on file   Number of children: 0   Years of education: Not on file   Highest education level: Not on file  Occupational History   Occupation: Therapist, sports at [employer]  Tobacco Use   Smoking status: Never   Smokeless tobacco: Never  Substance and Sexual Activity   Alcohol use: Not Currently   Drug use: Never   Sexual activity: Not on file  Other Topics Concern   Not on file  Social History Narrative   Not on file   Social Determinants of Health   Financial Resource Strain: Not on file  Food Insecurity: Not on file  Transportation Needs: Not on file  Physical Activity: Not on file  Stress: Not on file  Social Connections: Not on file  Intimate Partner Violence: Not on file   Current Outpatient Medications on File Prior to Visit  Medication Sig Dispense Refill   aspirin 81 MG EC tablet daily.     escitalopram (LEXAPRO) 20 MG tablet Take 1 tablet by mouth daily 90 tablet 1   escitalopram (LEXAPRO) 20 MG tablet Take 1 tablet by mouth once daily. 90 tablet 1   glucose blood (ACCU-CHEK GUIDE) test strip Use as instructed 3 times a day 200 each 3   Insulin Lispro-aabc (LYUMJEV) 100 UNIT/ML SOLN Inject up to 80 units a day in the insulin pump 70 mL 3   Lancets (ONETOUCH ULTRASOFT) lancets 1 each by Other route daily. To check blood sugars. 600 each  1   levothyroxine (SYNTHROID) 125 MCG tablet TAKE 1 TABLET BY MOUTH EVERY MORNING 90 tablet 3   Multiple Vitamin (MULTIVITAMIN ADULT PO) Take by mouth.     norgestrel-ethinyl estradiol (ELINEST) 0.3-30 MG-MCG tablet Take 1 tablet by mouth once a day 84 tablet 1   rosuvastatin (CRESTOR) 10 MG tablet Take 1 tablet by mouth daily 90 tablet 1   No current facility-administered medications on file prior to visit.   No Known Allergies   Family History  Problem Relation Age of Onset   Metabolic syndrome Mother    Thyroid disease Mother    Hyperlipidemia Father    Hypertension Father  Hypertension Sister     + Jon Gills and great aunt with type 2 diabetes + Grandmother with heart problems + Grandfather with Addison's disease  PE: BP 120/80 (BP Location: Left Arm, Patient Position: Sitting, Cuff Size: Normal)   Pulse 70   Ht '5\' 4"'  (1.626 m)   Wt 165 lb 12.8 oz (75.2 kg)   SpO2 99%   BMI 28.46 kg/m  Wt Readings from Last 3 Encounters:  12/31/21 165 lb 12.8 oz (75.2 kg)  09/01/21 173 lb 6.4 oz (78.7 kg)  04/30/21 170 lb (77.1 kg)   Constitutional: overweight, in NAD Eyes: PERRLA, EOMI, no exophthalmos ENT: moist mucous membranes, no thyromegaly, no cervical lymphadenopathy Cardiovascular: RRR, No MRG Respiratory: CTA B Musculoskeletal: no deformities, strength intact in all 4 Skin: moist, warm, no rashes Neurological: no tremor with outstretched hands, DTR normal in all 4 Diabetic Foot Exam - Simple   Simple Foot Form Visual Inspection No deformities, no ulcerations, no other skin breakdown bilaterally: Yes Sensation Testing Intact to touch and monofilament testing bilaterally: Yes Pulse Check Posterior Tibialis and Dorsalis pulse intact bilaterally: Yes Comments    ASSESSMENT: 1. DM1, uncontrolled, without long-term complications, but with hyperglycemia  2.  Hashimoto's Hypothyroidism  PLAN:  1. Patient with longstanding, type 1 diabetes, on insulin pump therapy.   She has always been on a Medtronic insulin pump, for the last 25 years.  We discussed at last visits about changing to the newest 770 G insulin pump.  At last visit she was telling me that she was waiting for the new pump to come in mail.  Since then, she was able to switch to this. -At last visit, reviewing her CGM trends, sugars were better throughout the day, with less variability.  She still had some variability after lunch and dinner.  She was describing that sugars can occasionally go higher after coffee in the morning as she was not always bolusing for coffee when sugars were at goal before drinking it.  We discussed about covering the coffee with insulin especially if she plans to be less active afterwards.  If she was going to work, and be active afterwards, we discussed that she may not need to bolus, especially if sugars were lower than 90 before starting it. CGM interpretation: -At today's visit, we reviewed her CGM downloads: It appears that 75% of values are in target range (goal >70%), while 25% are higher than 180 (goal <25%), and 0% are lower than 70 (goal <4%).  The calculated average blood sugar is 157.  The projected HbA1c for the next 3 months (GMI) is 7.1%. -Reviewing the CGM trends, sugars appear to be slightly better controlled than at last visit, but with still fluctuating values, many of them related to either not introducing enough carbs into the pump or entering them too late, while the sugars were really high after a meal.  We reviewed individual traces for the last 2 weeks and discussed about the need to bolus at the beginning of the meal and entered the entire amount of carbs.  After this, if sugars remain elevated, we may need to change her insulin to carb ratios.  However, for now, I advised her to try to do her best to enter blood sugars, carbs, and start the boluses before meals without changing the pump settings. -She is interested in the Medtronic 780 insulin pump we  discussed about some changes in this new pump compared to her pump.  We also discussed about the newly  FDA approved iLet pump.  She is interested in this, but this pump is very new and we will wait for now to see how it behaves after release on the market. - I suggested to: Patient Instructions  Please change: - basal rates: 12 am: 0.825 units/h 7 am: 0.750 8:30 am: 0.775 1 pm: 0.825 4 pm:0.975 - ICR: 1:11 except 5 pm-12 am: 1:9 - target: 95-110 - ISF: 45 - Insulin on Board: 3h  Try to always start the boluses at the beginning of the meal.  If sugars remain high, may need to change the ICR to 1:9 with b'fast and lunch and 1:8 before dinner.  Please return in 4 months.   - we checked her HbA1c: 7.8% (higher) - advised to check sugars at different times of the day - 4x a day, rotating check times - advised for yearly eye exams >> she is UTD - return to clinic in 4 months  2.  Hypothyroidism - latest thyroid labs reviewed with pt. >> normal: Lab Results  Component Value Date   TSH 1.06 09/01/2021  - she continues on LT4 125 mcg daily - pt feels good on this dose. - we discussed about taking the thyroid hormone every day, with water, >30 minutes before breakfast, separated by >4 hours from acid reflux medications, calcium, iron, multivitamins. Pt. is taking it correctly.  Philemon Kingdom, MD PhD [facility] Endocrinology

## 2022-01-06 ENCOUNTER — Other Ambulatory Visit (HOSPITAL_COMMUNITY): Payer: Self-pay

## 2022-01-06 ENCOUNTER — Other Ambulatory Visit: Payer: Self-pay | Admitting: Internal Medicine

## 2022-01-06 ENCOUNTER — Encounter: Payer: Self-pay | Admitting: Internal Medicine

## 2022-01-06 DIAGNOSIS — E1065 Type 1 diabetes mellitus with hyperglycemia: Secondary | ICD-10-CM | POA: Diagnosis not present

## 2022-01-06 DIAGNOSIS — E109 Type 1 diabetes mellitus without complications: Secondary | ICD-10-CM | POA: Diagnosis not present

## 2022-01-06 MED ORDER — GVOKE HYPOPEN 1-PACK 1 MG/0.2ML ~~LOC~~ SOAJ
SUBCUTANEOUS | 99 refills | Status: DC
  Filled 2022-01-06: qty 0.2, 1d supply, fill #0

## 2022-01-07 ENCOUNTER — Other Ambulatory Visit (HOSPITAL_COMMUNITY): Payer: Self-pay

## 2022-01-21 ENCOUNTER — Other Ambulatory Visit (HOSPITAL_COMMUNITY): Payer: Self-pay

## 2022-02-25 ENCOUNTER — Other Ambulatory Visit (HOSPITAL_COMMUNITY): Payer: Self-pay

## 2022-02-26 ENCOUNTER — Other Ambulatory Visit (HOSPITAL_COMMUNITY): Payer: Self-pay

## 2022-03-09 ENCOUNTER — Other Ambulatory Visit: Payer: Self-pay | Admitting: Family Medicine

## 2022-03-09 DIAGNOSIS — Z1231 Encounter for screening mammogram for malignant neoplasm of breast: Secondary | ICD-10-CM

## 2022-03-28 ENCOUNTER — Encounter: Payer: Self-pay | Admitting: Internal Medicine

## 2022-03-29 NOTE — Telephone Encounter (Signed)
Yes - thank you

## 2022-04-07 DIAGNOSIS — Z794 Long term (current) use of insulin: Secondary | ICD-10-CM | POA: Diagnosis not present

## 2022-04-07 DIAGNOSIS — E109 Type 1 diabetes mellitus without complications: Secondary | ICD-10-CM | POA: Diagnosis not present

## 2022-04-11 ENCOUNTER — Encounter: Payer: Self-pay | Admitting: Internal Medicine

## 2022-04-13 ENCOUNTER — Other Ambulatory Visit (HOSPITAL_COMMUNITY): Payer: Self-pay

## 2022-04-13 DIAGNOSIS — E1065 Type 1 diabetes mellitus with hyperglycemia: Secondary | ICD-10-CM | POA: Diagnosis not present

## 2022-04-13 DIAGNOSIS — E109 Type 1 diabetes mellitus without complications: Secondary | ICD-10-CM | POA: Diagnosis not present

## 2022-04-13 MED ORDER — ROSUVASTATIN CALCIUM 10 MG PO TABS
ORAL_TABLET | ORAL | 0 refills | Status: DC
  Filled 2022-04-13: qty 90, 90d supply, fill #0

## 2022-04-14 ENCOUNTER — Other Ambulatory Visit (HOSPITAL_COMMUNITY): Payer: Self-pay

## 2022-04-14 MED ORDER — ESCITALOPRAM OXALATE 20 MG PO TABS
ORAL_TABLET | ORAL | 0 refills | Status: DC
  Filled 2022-04-14: qty 90, 90d supply, fill #0

## 2022-04-15 ENCOUNTER — Other Ambulatory Visit (HOSPITAL_COMMUNITY): Payer: Self-pay

## 2022-04-15 MED ORDER — ELINEST 0.3-30 MG-MCG PO TABS
1.0000 | ORAL_TABLET | Freq: Every day | ORAL | 0 refills | Status: DC
  Filled 2022-04-15: qty 28, 28d supply, fill #0

## 2022-04-23 ENCOUNTER — Ambulatory Visit
Admission: RE | Admit: 2022-04-23 | Discharge: 2022-04-23 | Disposition: A | Discharge status: Home / Self Care | Payer: 59 | Source: Ambulatory Visit | Attending: Family Medicine | Admitting: Family Medicine

## 2022-04-23 DIAGNOSIS — Z1231 Encounter for screening mammogram for malignant neoplasm of breast: Secondary | ICD-10-CM | POA: Diagnosis not present

## 2022-04-27 ENCOUNTER — Other Ambulatory Visit (HOSPITAL_COMMUNITY): Payer: Self-pay

## 2022-04-27 MED ORDER — PAXLOVID (300/100) 20 X 150 MG & 10 X 100MG PO TBPK
3.0000 | ORAL_TABLET | Freq: Two times a day (BID) | ORAL | 0 refills | Status: DC
  Filled 2022-04-27: qty 30, 5d supply, fill #0

## 2022-05-03 ENCOUNTER — Other Ambulatory Visit (HOSPITAL_COMMUNITY): Payer: Self-pay

## 2022-05-13 ENCOUNTER — Encounter: Payer: Self-pay | Admitting: Internal Medicine

## 2022-05-13 ENCOUNTER — Ambulatory Visit: Payer: 59 | Admitting: Internal Medicine

## 2022-05-13 VITALS — BP 110/72 | HR 82 | Ht 64.0 in | Wt 168.8 lb

## 2022-05-13 DIAGNOSIS — E1065 Type 1 diabetes mellitus with hyperglycemia: Secondary | ICD-10-CM

## 2022-05-13 DIAGNOSIS — E038 Other specified hypothyroidism: Secondary | ICD-10-CM

## 2022-05-13 DIAGNOSIS — E063 Autoimmune thyroiditis: Secondary | ICD-10-CM

## 2022-05-13 LAB — POCT GLYCOSYLATED HEMOGLOBIN (HGB A1C): Hemoglobin A1C: 7.7 % — AB (ref 4.0–5.6)

## 2022-05-13 NOTE — Patient Instructions (Addendum)
Please use the following pump settings: - basal rates: 12 am: 0.825 units/h 7 am: 0.750 8:30 am: 0.775 1 pm: 0.825 4 pm:0.975 - ICR:  12 am-5 pm: 1:13 >> 1:12 (try to go back to 1:11 if still high sugars after meals) 5 pm-12 am: 1:11 - target: 95-110 >> 110-110 - ISF: 45 - Insulin on Board: 3h  Please return in 4 months.

## 2022-05-13 NOTE — Progress Notes (Signed)
Patient ID: LESLY Brooks, female   DOB: 01/24/1976, 46 y.o.   MRN: [id number]   HPI: Katrina Brooks is a 46 y.o.-year-old female, initially referred by her PCP, Dr. Tessa Lerner, returning for follow-up for DM1, diagnosed as a child (89 y/o), uncontrolled, without complications and Hashimoto's hypothyroidism.  She moved from [place].  Last visit 4.5 months ago.  Interim history: No increased urination, blurry vision, nausea, chest pain. She previously lost 20 pounds (last year) using the "Lose it" app and counting calories.  Before last visit she lost 7 pounds. She has Covid in 04/2022. She recovered well, but still has some cough. After switching to the new Medtronic pump last month, she developed some lows so we had to relax her insulin to carb ratios.  Reviewed HbA1c levels: Lab Results  Component Value Date   HGBA1C 7.8 (A) 12/31/2021   HGBA1C 7.3 (A) 09/01/2021   HGBA1C 7.7 (A) 04/30/2021   HGBA1C 7.5 (A) 12/23/2020   HGBA1C 7.7 (A) 09/10/2020   HGBA1C 8.1 (A) 06/02/2020    Previously:   Insulin pump:  -since 46 y/o -only had Medtronic pumps -Medtronic 670 G - since 2018 - with clear ring >> got a replacement -Medtronic 770G - since 01-09/2021 -Medtronic 780G - since 04/2022  CGM: -Medtronic Guardian  Insulin: -Previously on NovoLog - [place] approved -tried Apidra >> did not like it -tried Humalog >> slower onset, was not controlling her sugars very well -Now Lyumjev  Supplies: -prev. Medtronic  -[place] now  Pump settings: - basal rates: 12 am: 0.825 units/h 7 am: 0.750 8:30 am: 0.775 1 pm: 0.825 4 pm:0.975 - ICR: - ICR: 1:11 >> 1:13 except 5 pm-12 am: 1:9 >> 1:11 - target: 85-130 >> 95-110 - ISF: 45 - Insulin on Board: 3h - bolus wizard: on - extended bolusing: not using - changes infusion site: q 5.5 >> q3-4 days TDD from basal insulin: 56% (19.9 units) >> 49% >> 63% >> 45% TDD from bolus insulin: 44% (15.5 units) >> 51% >> 37% >> 55% Total daily  dose: 40-70 units daily Sensor alarms: 60-200 Meter: Contour Next  She checks her sugars more than 4 times a day with her CGM:   Previously:   Previously:  Lowest sugar was <40 (yardwork) ... >> 60 >> 40's, now 57s; she has hypoglycemia awareness in the 60s.  She has a glucagon kit at home.  No previous hypoglycemia admission. Highest sugar was 500 (steroids for Plantar fasciitis) >> .Marland KitchenMarland Kitchen 300s (after correction of a low) >> .  No previous DKA admissions.  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 CAC was 0 in 07/2019. On  ASA 81.  - last eye exam was in 03/2022: No DR reportedly.  - No numbness and tingling in her feet.  Last foot exam 12/2021.  Pt has FH of DM2 in grandfather, great aunt. Metabolic syndrome in mother.  Hypothyroidism: -Due to Hashimoto's thyroiditis per review of records from previous endocrinologist  Pt is on levothyroxine 125 mcg daily, taken: - in am - fasting - at least 30 min from b'fast - no calcium - no iron - + multivitamins at night - no PPIs - not on Biotin  Reviewed TSH levels: Lab Results  Component Value Date   TSH 1.06 09/01/2021   TSH 1.44 10/29/2020   TSH  5.12 (H) 09/10/2020  11/02/2019: TSH 1.07 (0.4-4.5), free T4 1.2 (0.8-1.8)  She is on OCPs for irregular menses.  In [place], she was working in the [employer].  ROS: + See HPI  I reviewed pt's medications, allergies, PMH, social hx, family hx, and changes were documented in the history of present illness. Otherwise, unchanged from my initial visit note.  Past Surgical History:  Procedure Laterality Date   BUNIONECTOMY Right 2016   CYST REMOVAL LEG Bilateral    Bilateral feet-2006 and 2016   TEAR DUCT PROBING     As a child   TONSILLECTOMY  2001   Social History   Socioeconomic History   Marital status: Married    Spouse name: Not on file   Number of children: 0   Years of education: Not on file   Highest education level: Not on file  Occupational History   Occupation: Therapist, sports at [employer]  Tobacco Use   Smoking status: Never   Smokeless tobacco: Never  Substance and Sexual Activity   Alcohol use: Not Currently   Drug use: Never   Sexual activity: Not on file  Other Topics Concern   Not on file  Social History Narrative   Not on file   Social Determinants of Health   Financial Resource Strain: Not on file  Food Insecurity: Not on file  Transportation Needs: Not on file  Physical Activity: Not on file  Stress: Not on file  Social Connections: Not on file  Intimate Partner Violence: Not on file   Current Outpatient Medications on File Prior to Visit  Medication Sig Dispense Refill   aspirin 81 MG EC tablet daily.     escitalopram (LEXAPRO) 20 MG tablet Take 1 tablet by mouth daily 90 tablet 1   escitalopram (LEXAPRO) 20 MG tablet Take 1 tablet by mouth once a day 90 tablet 0   Glucagon 3 MG/DOSE POWD Place 3 mg into the nose once as needed for up to 1 dose. 1 each 11   glucose blood (ACCU-CHEK GUIDE) test strip Use as instructed 3 times a day 200 each 3   GVOKE HYPOPEN 1-PACK 1 MG/0.2ML SOAJ Use as needed for hypoglycemia 0.2 mL PRN   Insulin Lispro-aabc  (LYUMJEV) 100 UNIT/ML SOLN Inject up to 80 units a day in the insulin pump 70 mL 3   Lancets (ONETOUCH ULTRASOFT) lancets 1 each by Other route daily. To check blood sugars. 600 each 1   levothyroxine (SYNTHROID) 125 MCG tablet TAKE 1 TABLET BY MOUTH EVERY MORNING 90 tablet 3   Multiple Vitamin (MULTIVITAMIN ADULT PO) Take by mouth.     nirmatrelvir & ritonavir (PAXLOVID, 300/100,) 20 x 150 MG & 10 x 100MG TBPK Take 3 tablets by mouth 2 (two) times daily for 5 days 30 tablet 0  norgestrel-ethinyl estradiol (ELINEST) 0.3-30 MG-MCG tablet Take 1 tablet by mouth once a day 28 tablet 0   rosuvastatin (CRESTOR) 10 MG tablet Take 1 tablet by mouth daily 90 tablet 1   rosuvastatin (CRESTOR) 10 MG tablet Take 1 tablet by mouth once a day. 90 tablet 0   No current facility-administered medications on file prior to visit.   No Known Allergies   Family History  Problem Relation Age of Onset   Metabolic syndrome Mother    Thyroid disease Mother    Hyperlipidemia Father    Hypertension Father    Hypertension Sister     + Jon Gills and great aunt with type 2 diabetes + Grandmother with heart problems + Grandfather with Addison's disease  PE: BP 110/72 (BP Location: Left Arm, Patient Position: Sitting, Cuff Size: Normal)   Pulse 82   Ht 5' 4" (1.626 m)   Wt 168 lb 12.8 oz (76.6 kg)   SpO2 97%   BMI 28.97 kg/m  Wt Readings from Last 3 Encounters:  05/13/22 168 lb 12.8 oz (76.6 kg)  12/31/21 165 lb 12.8 oz (75.2 kg)  09/01/21 173 lb 6.4 oz (78.7 kg)   Constitutional: Slightly overweight, in NAD Eyes:  EOMI, no exophthalmos ENT: no neck masses, no cervical lymphadenopathy Cardiovascular: RRR, No MRG Respiratory: CTA B Musculoskeletal: no deformities Skin:no rashes Neurological: no tremor with outstretched hands  ASSESSMENT: 1. DM1, uncontrolled, without long-term complications, but with hyperglycemia  2.  Hashimoto's Hypothyroidism  PLAN:  1. Patient with longstanding, type 1  diabetes, on insulin pump therapy.  She has always been on the Medtronic insulin pump, for the last 25 years.  She is now on the 780 G insulin pump.  At last visit, reviewing her CGM trends, sugars were slightly better controlled, with still fluctuating values, many of the related to either not introducing enough carbs into the pump or entering them too late, when the sugars were already high after a meal.  We discussed about the need to bolus at the beginning of the meal and entered the entire amount of carbs.  After this, I advised her that if the sugars remain elevated, to strengthen her insulin to carb ratios. -At last visit, she was interested in the Medtronic 780 insulin pump and we discussed about some changes in this time compared to the previous.  We also discussed about the newly FDA approved iLet pump -she would be interested in starting this when available. CGM interpretation: -At today's visit, we reviewed her CGM downloads: It appears that 75% of values are in target range (goal >70%), while 24% are higher than 180 (goal <25%), and 1% are lower than 70 (goal <4%).  The calculated average blood sugar is 157.  The projected HbA1c for the next 3 months (GMI) is 1.1%. -Reviewing the CGM trends, it appears that sugars are fluctuating within a fairly narrow range, but with increasing blood sugars closely after lunch and occasionally after breakfast/coffee in am or dinner.  She is getting correction boluses from the pump but she feels that these are not efficient and dropping her sugars too low.  We reviewed her pump settings and her target is set for 140 auto mode.  We cannot decrease this lower.  We discussed that we need to work on getting enough insulin for meals so I suggested strengthening her insulin to carb ratios with b'fast and lunch. -I also advised her to relax her target when out of the auto mode a little more to  avoid low blood sugars -We can continue the same basal rates for now - I  suggested to: Patient Instructions  Please use the following pump settings: - basal rates: 12 am: 0.825 units/h 7 am: 0.750 8:30 am: 0.775 1 pm: 0.825 4 pm:0.975 - ICR:  12 am-5 pm: 1:13 >> 1:12 (try to go back to 1:11 if still high sugars after meals) 5 pm-12 am: 1:11 - target: 95-110 >> 110-110 - ISF: 45 - Insulin on Board: 3h  Please return in 4 months.   - we checked her HbA1c: 7.7% (slightly lower) - advised to check sugars at different times of the day - 4x a day, rotating check times - advised for yearly eye exams >> she is UTD - return to clinic in 4 months  2.  Hypothyroidism - latest thyroid labs reviewed with pt. >> normal: Lab Results  Component Value Date   TSH 1.06 09/01/2021  - she continues on LT4 125 mcg daily - pt feels good on this dose. - we discussed about taking the thyroid hormone every day, with water, >30 minutes before breakfast, separated by >4 hours from acid reflux medications, calcium, iron, multivitamins. Pt. is taking it correctly. - will check thyroid tests at next visit if not checked at next visit with PCP-planned for later this month.  Philemon Kingdom, MD PhD [facility] End[facility]

## 2022-05-24 DIAGNOSIS — E1065 Type 1 diabetes mellitus with hyperglycemia: Secondary | ICD-10-CM | POA: Diagnosis not present

## 2022-05-24 DIAGNOSIS — E7800 Pure hypercholesterolemia, unspecified: Secondary | ICD-10-CM | POA: Diagnosis not present

## 2022-05-24 DIAGNOSIS — Z3041 Encounter for surveillance of contraceptive pills: Secondary | ICD-10-CM | POA: Diagnosis not present

## 2022-05-24 DIAGNOSIS — E039 Hypothyroidism, unspecified: Secondary | ICD-10-CM | POA: Diagnosis not present

## 2022-05-24 DIAGNOSIS — Z9641 Presence of insulin pump (external) (internal): Secondary | ICD-10-CM | POA: Diagnosis not present

## 2022-05-24 DIAGNOSIS — F324 Major depressive disorder, single episode, in partial remission: Secondary | ICD-10-CM | POA: Diagnosis not present

## 2022-05-24 DIAGNOSIS — Z0000 Encounter for general adult medical examination without abnormal findings: Secondary | ICD-10-CM | POA: Diagnosis not present

## 2022-05-24 DIAGNOSIS — Z124 Encounter for screening for malignant neoplasm of cervix: Secondary | ICD-10-CM | POA: Diagnosis not present

## 2022-05-24 DIAGNOSIS — Z6829 Body mass index (BMI) 29.0-29.9, adult: Secondary | ICD-10-CM | POA: Diagnosis not present

## 2022-05-25 ENCOUNTER — Other Ambulatory Visit (HOSPITAL_COMMUNITY): Payer: Self-pay

## 2022-05-25 ENCOUNTER — Other Ambulatory Visit: Payer: Self-pay | Admitting: Internal Medicine

## 2022-05-25 DIAGNOSIS — E038 Other specified hypothyroidism: Secondary | ICD-10-CM

## 2022-05-25 MED ORDER — ELINEST 0.3-30 MG-MCG PO TABS
1.0000 | ORAL_TABLET | Freq: Every day | ORAL | 3 refills | Status: DC
  Filled 2022-05-25: qty 84, 84d supply, fill #0
  Filled 2022-08-22: qty 84, 84d supply, fill #1
  Filled 2022-11-13: qty 84, 84d supply, fill #2
  Filled 2023-02-04: qty 84, 84d supply, fill #3

## 2022-05-25 MED ORDER — VALACYCLOVIR HCL 500 MG PO TABS
500.0000 mg | ORAL_TABLET | Freq: Every day | ORAL | 1 refills | Status: DC
  Filled 2022-05-25: qty 90, 90d supply, fill #0

## 2022-05-25 MED ORDER — LYUMJEV 100 UNIT/ML IJ SOLN
INTRAMUSCULAR | 3 refills | Status: DC
  Filled 2022-05-25: qty 70, 88d supply, fill #0
  Filled 2022-08-22: qty 70, 88d supply, fill #1
  Filled 2022-11-22 (×2): qty 70, 88d supply, fill #2
  Filled 2023-02-15: qty 70, 88d supply, fill #3

## 2022-05-25 MED ORDER — LEVOTHYROXINE SODIUM 125 MCG PO TABS
125.0000 ug | ORAL_TABLET | Freq: Every morning | ORAL | 3 refills | Status: DC
  Filled 2022-05-25: qty 90, 90d supply, fill #0
  Filled 2022-08-22: qty 90, 90d supply, fill #1
  Filled 2022-11-22 (×2): qty 90, 90d supply, fill #2
  Filled 2023-02-15: qty 90, 90d supply, fill #3

## 2022-05-26 ENCOUNTER — Other Ambulatory Visit (HOSPITAL_COMMUNITY): Payer: Self-pay

## 2022-05-28 IMAGING — MG MM DIGITAL SCREENING BILAT W/ TOMO AND CAD
8 series · 8 of 24 positions shown · non-contrast
Comparison: Previous exam(s).

CLINICAL DATA: Screening.

EXAM:
DIGITAL SCREENING BILATERAL MAMMOGRAM WITH TOMOSYNTHESIS AND CAD
TECHNIQUE: Bilateral screening digital craniocaudal and mediolateral oblique
mammograms were obtained. Bilateral screening digital breast
tomosynthesis was performed. The images were evaluated with
computer-aided detection.

[R MLO synth-2D]
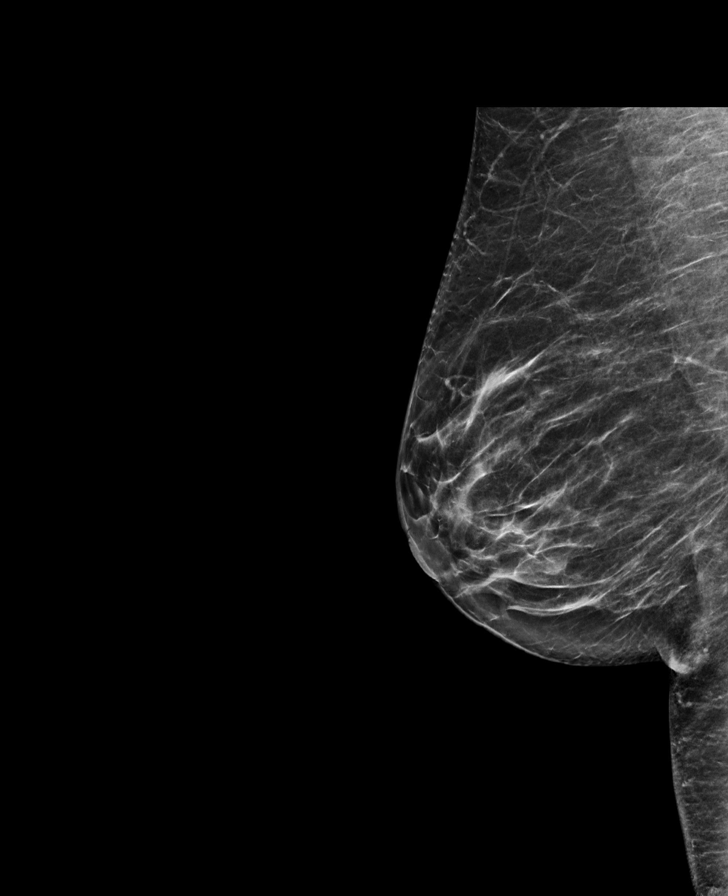

[R CC synth-2D]
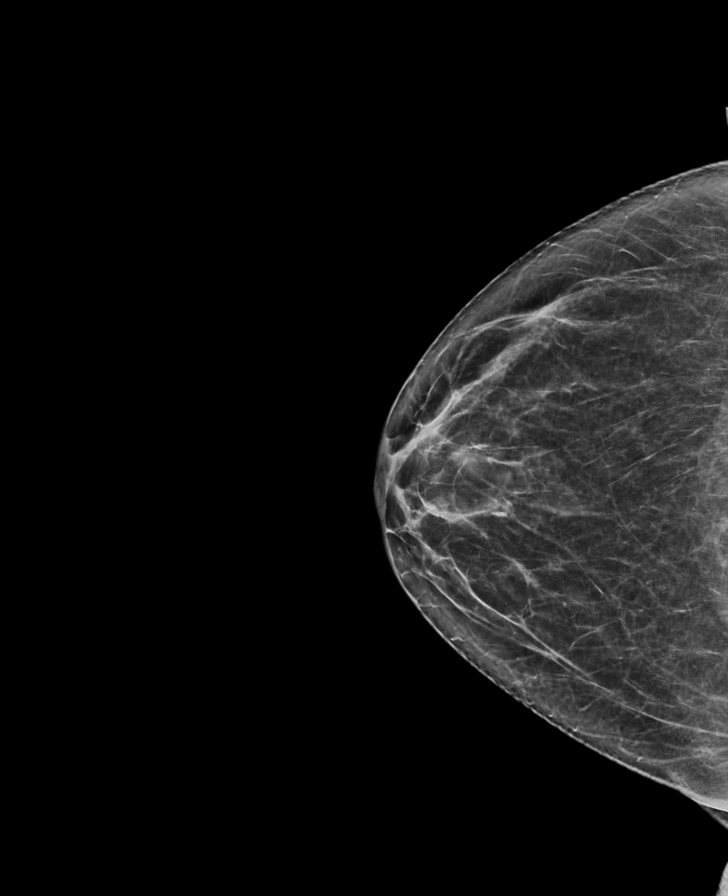

[L CC synth-2D]
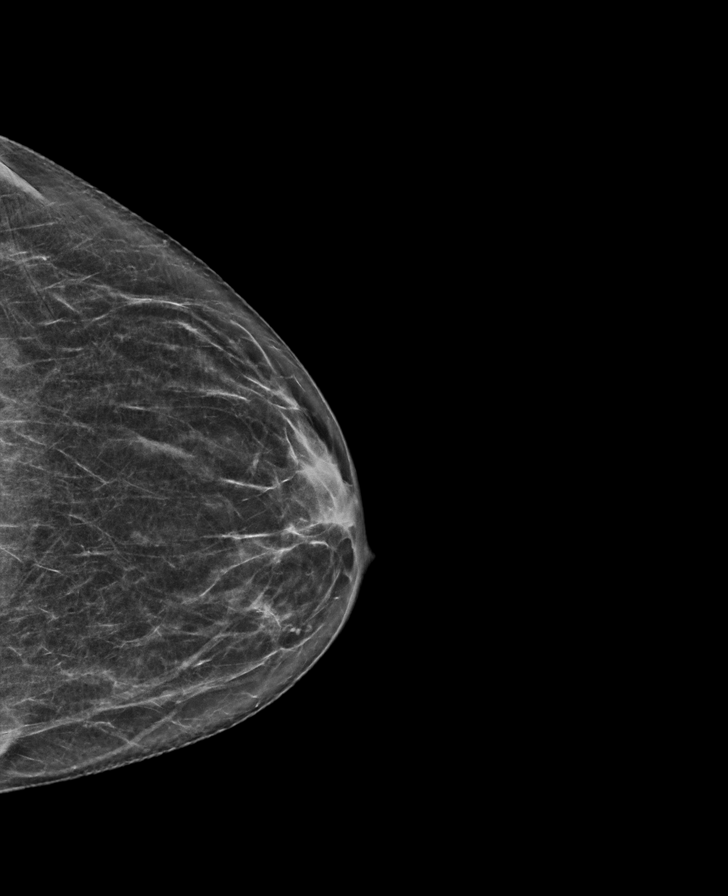

[L MLO synth-2D]
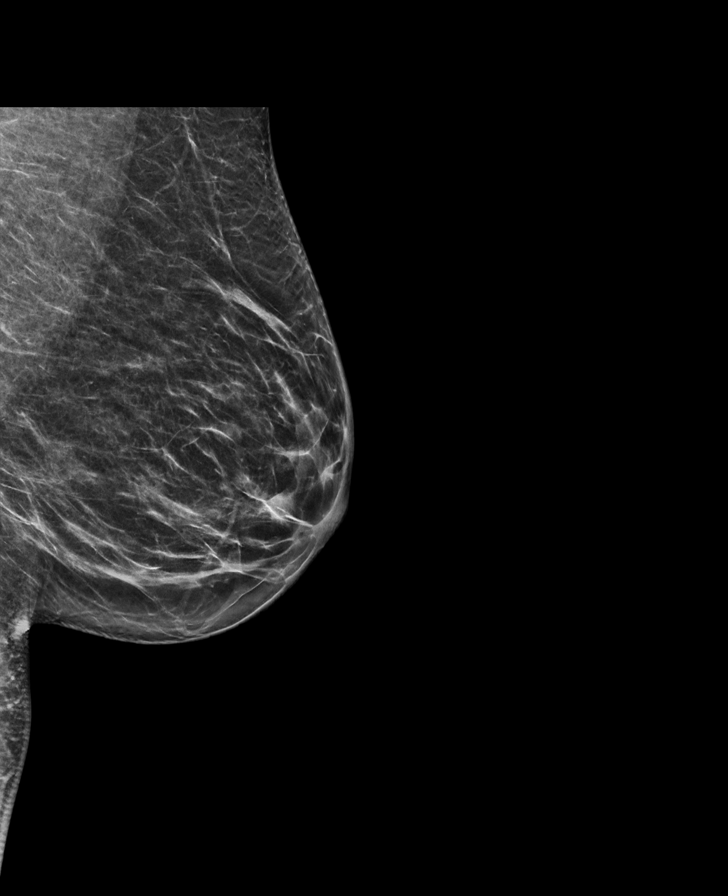

[R MLO tomo · tomo slice 38/75.0]
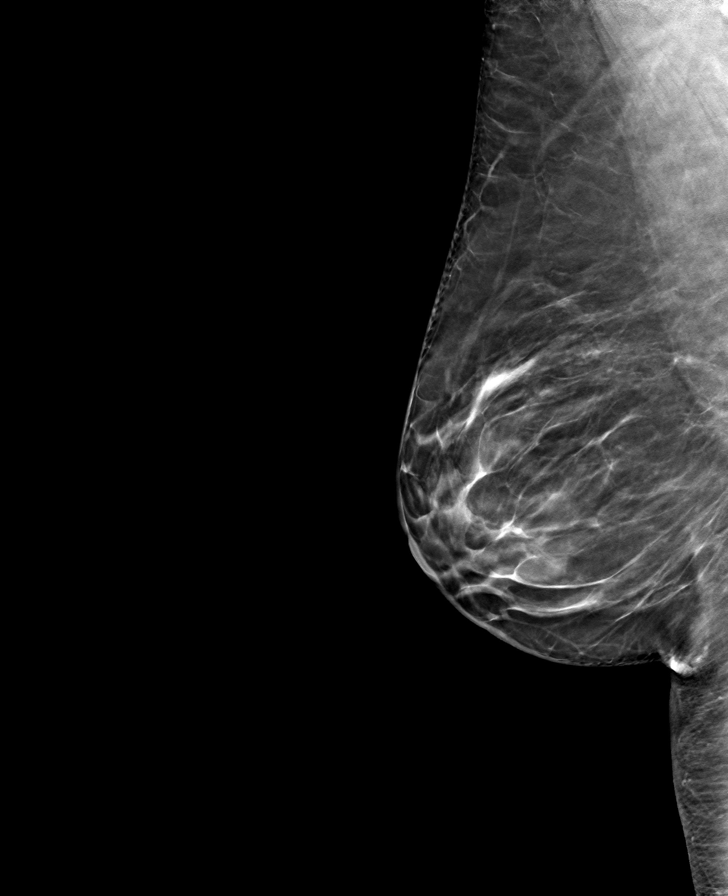

[R CC tomo · tomo slice 33/66.0]
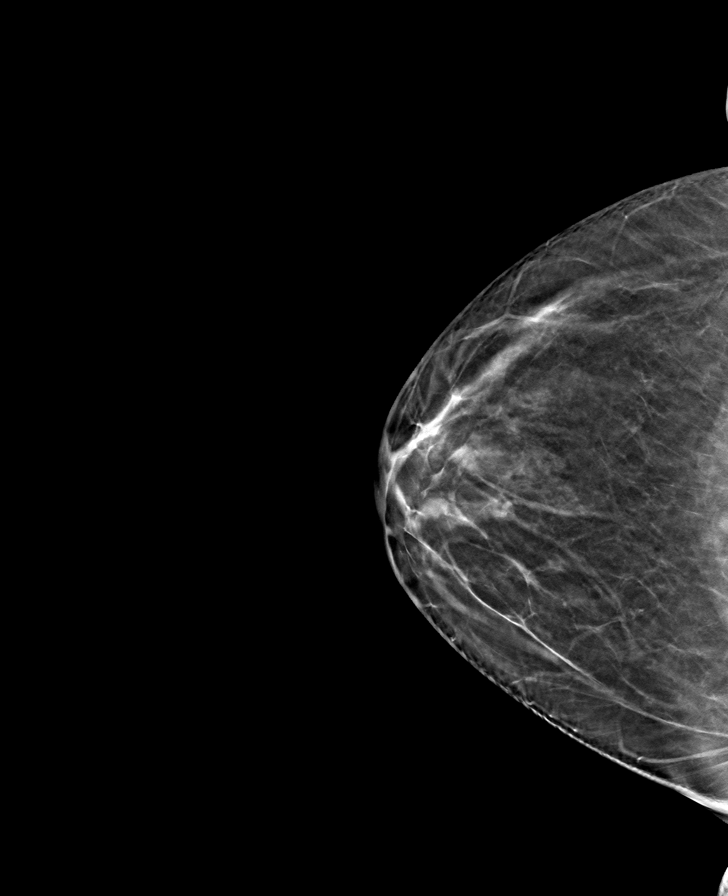

[L MLO tomo · tomo slice 35/68.0]
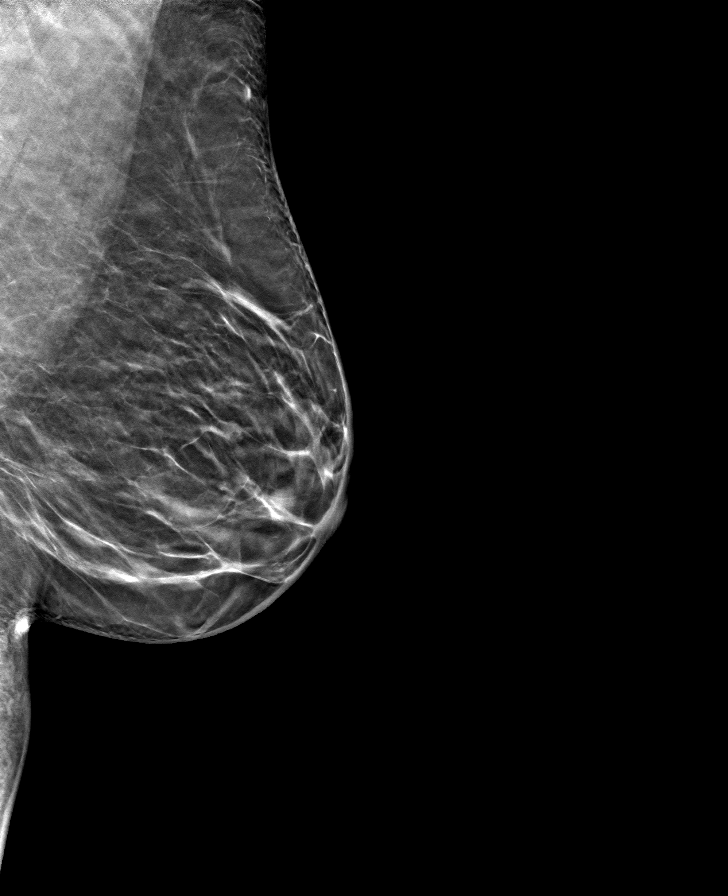

[L CC tomo · tomo slice 33/66.0]
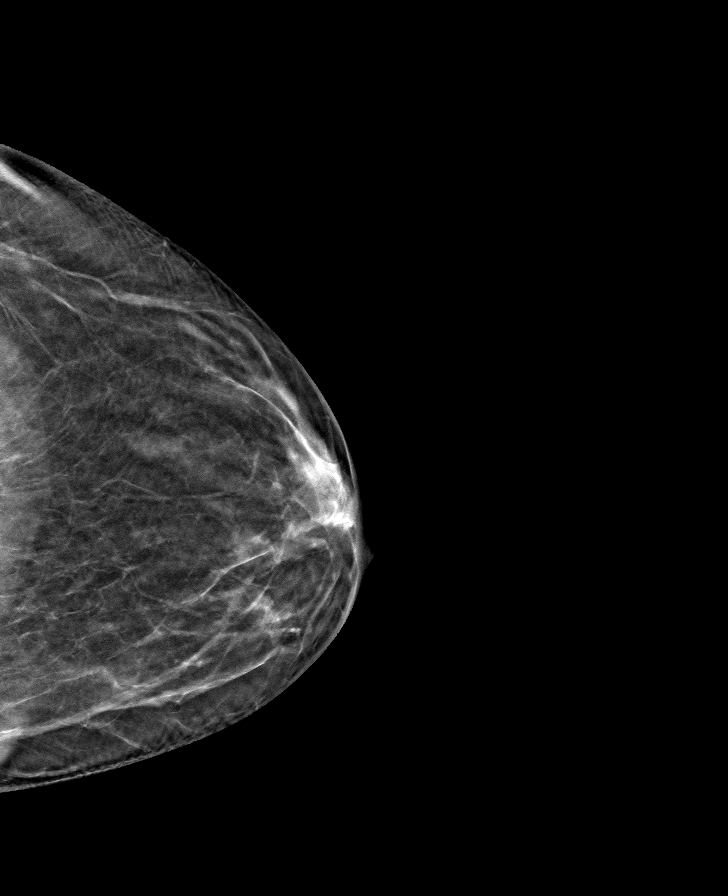

[8 of 24 positions shown; findings below may reference images not displayed]

ACR Breast Density Category b: There are scattered areas of
fibroglandular density.
FINDINGS: There are no findings suspicious for malignancy.
IMPRESSION: No mammographic evidence of malignancy. A result letter of this
screening mammogram will be mailed directly to the patient.

RECOMMENDATION:
Screening mammogram in one year. (Code:51-O-LD2)

BI-RADS CATEGORY  1: Negative.

## 2022-05-29 ENCOUNTER — Other Ambulatory Visit (HOSPITAL_COMMUNITY): Payer: Self-pay

## 2022-06-24 DIAGNOSIS — Z124 Encounter for screening for malignant neoplasm of cervix: Secondary | ICD-10-CM | POA: Diagnosis not present

## 2022-06-25 ENCOUNTER — Other Ambulatory Visit (HOSPITAL_COMMUNITY): Payer: Self-pay

## 2022-06-25 DIAGNOSIS — M5137 Other intervertebral disc degeneration, lumbosacral region: Secondary | ICD-10-CM | POA: Diagnosis not present

## 2022-06-25 DIAGNOSIS — M5432 Sciatica, left side: Secondary | ICD-10-CM | POA: Diagnosis not present

## 2022-06-25 MED ORDER — METHYLPREDNISOLONE 4 MG PO TBPK
ORAL_TABLET | ORAL | 0 refills | Status: DC
  Filled 2022-06-25: qty 21, 6d supply, fill #0

## 2022-07-08 ENCOUNTER — Other Ambulatory Visit: Payer: Self-pay | Admitting: Internal Medicine

## 2022-07-08 ENCOUNTER — Other Ambulatory Visit (HOSPITAL_COMMUNITY): Payer: Self-pay

## 2022-07-08 DIAGNOSIS — E1065 Type 1 diabetes mellitus with hyperglycemia: Secondary | ICD-10-CM

## 2022-07-09 ENCOUNTER — Other Ambulatory Visit (HOSPITAL_COMMUNITY): Payer: Self-pay

## 2022-07-09 MED ORDER — ACCU-CHEK GUIDE VI STRP
ORAL_STRIP | 3 refills | Status: DC
Start: 2022-07-09 — End: 2023-06-30
  Filled 2022-07-09: qty 200, 67d supply, fill #0

## 2022-07-09 MED ORDER — ROSUVASTATIN CALCIUM 10 MG PO TABS
10.0000 mg | ORAL_TABLET | Freq: Every day | ORAL | 1 refills | Status: DC
  Filled 2022-07-09: qty 90, 90d supply, fill #0
  Filled 2022-10-08 – 2022-10-23 (×2): qty 90, 90d supply, fill #1

## 2022-07-09 MED ORDER — ESCITALOPRAM OXALATE 20 MG PO TABS
20.0000 mg | ORAL_TABLET | Freq: Every day | ORAL | 1 refills | Status: DC
  Filled 2022-07-09: qty 90, 90d supply, fill #0
  Filled 2022-10-08 – 2022-10-23 (×2): qty 90, 90d supply, fill #1

## 2022-07-12 ENCOUNTER — Other Ambulatory Visit (HOSPITAL_COMMUNITY): Payer: Self-pay

## 2022-07-14 DIAGNOSIS — E109 Type 1 diabetes mellitus without complications: Secondary | ICD-10-CM | POA: Diagnosis not present

## 2022-07-14 DIAGNOSIS — E1065 Type 1 diabetes mellitus with hyperglycemia: Secondary | ICD-10-CM | POA: Diagnosis not present

## 2022-08-18 ENCOUNTER — Other Ambulatory Visit (HOSPITAL_COMMUNITY): Payer: Self-pay

## 2022-08-23 ENCOUNTER — Other Ambulatory Visit (HOSPITAL_COMMUNITY): Payer: Self-pay

## 2022-08-23 ENCOUNTER — Other Ambulatory Visit: Payer: Self-pay

## 2022-08-25 ENCOUNTER — Other Ambulatory Visit (HOSPITAL_COMMUNITY): Payer: Self-pay

## 2022-09-10 ENCOUNTER — Ambulatory Visit: Payer: Commercial Managed Care - PPO | Admitting: Internal Medicine

## 2022-09-10 ENCOUNTER — Encounter: Payer: Self-pay | Admitting: Internal Medicine

## 2022-09-10 VITALS — BP 110/72 | HR 85 | Ht 64.0 in | Wt 175.0 lb

## 2022-09-10 DIAGNOSIS — E1065 Type 1 diabetes mellitus with hyperglycemia: Secondary | ICD-10-CM

## 2022-09-10 DIAGNOSIS — E038 Other specified hypothyroidism: Secondary | ICD-10-CM

## 2022-09-10 DIAGNOSIS — E063 Autoimmune thyroiditis: Secondary | ICD-10-CM

## 2022-09-10 LAB — T4, FREE: Free T4: 0.88 ng/dL (ref 0.60–1.60)

## 2022-09-10 LAB — POCT GLYCOSYLATED HEMOGLOBIN (HGB A1C): Hemoglobin A1C: 7 % — AB (ref 4.0–5.6)

## 2022-09-10 LAB — TSH: TSH: 1.56 u[IU]/mL (ref 0.35–5.50)

## 2022-09-10 NOTE — Patient Instructions (Addendum)
Please use the following pump settings: - basal rates: 12 am: 0.875 units/h 7 am: 0.800 8:30 am: 0.825 1 pm: 0.875 4 pm: 1.050 - ICR:  12 am-5 pm: 1:12 5 pm-12 am: 1:11 - target: 110-110 - ISF: 45 - Insulin on Board: 3h    Please continue Levothyroxine 125 cg daily.  Take the thyroid hormone every day, with water, at least 30 minutes before breakfast, separated by at least 4 hours from: - acid reflux medications - calcium - iron - multivitamins  Please return in 4 months.

## 2022-09-10 NOTE — Progress Notes (Signed)
Patient ID: Katrina Brooks, female   DOB: 03-18-1976, 47 y.o.   MRN: [id number]   HPI: Katrina Brooks is a 47 y.o.-year-old female, initially referred by her PCP, Dr. Tessa Brooks, returning for follow-up for DM1, diagnosed as a child (47 y/o), uncontrolled, without complications and Hashimoto's hypothyroidism.  She moved from [place].  Last visit 4 months ago.  Interim history: No increased urination, blurry vision, nausea, chest pain. She previously lost 20 pounds (last year) using the "Lose it" app and counting calories.  Weight loss slowed down afterwards. She gained several lbs since last OV. She just restarted going to the gym. She had back strain >> was on steroid taper - sugars higher.  Reviewed HbA1c levels: Lab Results  Component Value Date   HGBA1C 7.7 (A) 05/13/2022   HGBA1C 7.8 (A) 12/31/2021   HGBA1C 7.3 (A) 09/01/2021   HGBA1C 7.7 (A) 04/30/2021   HGBA1C 7.5 (A) 12/23/2020   HGBA1C 7.7 (A) 09/10/2020   HGBA1C 8.1 (A) 06/02/2020    Previously:   Insulin pump:  -since 47 y/o -only had Medtronic pumps -Medtronic 670 G - since 2018 - with clear ring >> got a replacement -Medtronic 770G - since 01-09/2021 -Medtronic 780G - since 04/2022  CGM: -Medtronic Guardian  Insulin: -Previously on NovoLog - [place] approved -tried Apidra >> did not like it -tried Humalog >> slower onset, was not controlling her sugars very well -Now Lyumjev  Supplies: -prev. Medtronic  -[place] now  Pump settings: - basal rates: 12 am: 0.825 units/h 7 am: 0.750 8:30 am: 0.775 1 pm: 0.825 4 pm:0.975 - ICR:  12 am-5 pm: 1:13 >> 1:12 (try to go back to 1:11 if still high sugars after meals) 5 pm-12 am: 1:11 - target: 95-110 >> 110-110 - ISF: 45 - Insulin on Board: 3h - bolus wizard: on - extended bolusing: not using - changes infusion site: q 5.5 >> q3-4 days TDD from basal insulin: 56% (19.9 units) >> 49% >> 63% >> 45% TDD from bolus insulin: 44% (15.5 units) >> 51% >> 37% >>  55% Total daily dose: 40-70 units daily Sensor alarms: 60-200 Meter: Contour Next  She checks her sugars more than 4 times a day with her CGM:  Previously:   Previously:   Lowest sugar was <40 (yardwork) ... >> 40's >> 60s >> 60s; she has hypoglycemia awareness in the 60s.  She has a glucagon kit at home.  No previous hypoglycemia admission. Highest sugar was 500 (steroids for Plantar fasciitis) >> .Marland KitchenMarland Kitchen 300s (after correction of a low) >> 306.  No previous DKA admissions.  Pt's meals are: - Breakfast: Coffee, [place] sausage sticks, cottage cheese, fruit; skips when not working >> mostly coffee, skips - Lunch: [employer] meal or sandwich, fruit - Dinner: Meat and vegetables or seafood - Snacks: several, various She works 4 days a week, 8 AM to 5:30 PM (at [employer])  -No CKD but history of MAU, last BUN/creatinine:  Comp Metabolic Panel Reviewed LKGM:08/11/7251 07:52:39 AM Interpretation: Performing Lab: Notes/Report: Testing Performed at: [facility], [address], [address], [place], [place] [address]  Glucose 127 70-99 mg/dL  BUN 8 6-26 mg/dL  Creatinine 0.77 0.60-1.30 mg/dl  eGFR2021 97 >60 calc  Sodium 138 136-145 mmol/L  Potassium 4.9 3.5-5.5 mmol/L  Chloride 105 98-107 mmol/L  CO2 28 22-32 mmol/L  Anion Gap 10.2 6.0-20.0 mmol/L  Calcium 9.1 8.6-10.3 mg/dL  CA-corrected 8.99 8.60-10.30 mg/dL  Protein, Total 6.2 6.0-8.3 g/dL  Albumin 4.1 3.4-4.8  g/dL  TBIL 0.5 2.5-3.6 mg/dL  ALP 62 64-403 U/L  AST 16 0-39 U/L  ALT 11 0-52 U/L   Lab Results  Component Value Date   BUN 9 09/01/2021   BUN 9 09/10/2020   CREATININE 0.72 09/01/2021   CREATININE 0.72 09/10/2020  11/02/2019:13/0.91, GFR 78, glucose 159, protein to creatinine ratio in 24-hour urine 0.097 (< OR = 0.114)  -+ HL; last set of lipids: 05/13/2022: 130/52/58/60 Lab Results  Component Value Date   CHOL 129 09/01/2021   HDL 58.50 09/01/2021   LDLCALC 56 09/01/2021   TRIG 72.0 09/01/2021   CHOLHDL 2  09/01/2021  11/02/2019: 124/62/66/54 On Crestor 10 per cardiology. CAC was 0 in 07/2019. On ASA 81.  - last eye exam: Reviewed date:05/27/2022 01:36:35 PM Interpretation:No diabetic retinopathy   - No numbness and tingling in her feet.  Last foot exam 12/2021.  Pt has FH of DM2 in grandfather, great aunt. Metabolic syndrome in mother.  Hypothyroidism: -Due to Hashimoto's thyroiditis per review of records from previous endocrinologist  Pt is on levothyroxine 125 mcg daily, taken: - in am - fasting - at least 30 min from b'fast - no calcium - no iron - + multivitamins at night - no PPIs - not on Biotin  Reviewed TSH levels: Lab Results  Component Value Date   TSH 1.06 09/01/2021   TSH 1.44 10/29/2020   TSH 5.12 (H) 09/10/2020  11/02/2019: TSH 1.07 (0.4-4.5), free T4 1.2 (0.8-1.8)  She is on OCPs for irregular menses.  In [place], she was working in the [employer].  ROS: + See HPI  I reviewed pt's medications, allergies, PMH, social hx, family hx, and changes were documented in the history of present illness. Otherwise, unchanged from my initial visit note.  Past Surgical History:  Procedure Laterality Date   BUNIONECTOMY Right 2016   CYST REMOVAL LEG Bilateral    Bilateral feet-2006 and 2016   TEAR DUCT PROBING     As a child   TONSILLECTOMY  2001   Social History   Socioeconomic History   Marital status: Married    Spouse name: Not on file   Number of children: 0   Years of education: Not on file   Highest education level: Not on file  Occupational History   Occupation: Charity fundraiser at [employer]  Tobacco Use   Smoking status: Never   Smokeless tobacco: Never  Substance and Sexual Activity   Alcohol use: Not Currently   Drug use: Never   Sexual activity: Not on file  Other Topics Concern   Not on file  Social History Narrative   Not on file   Social Determinants of Health   Financial Resource Strain: Not on file  Food Insecurity: Not on file   Transportation Needs: Not on file  Physical Activity: Not on file  Stress: Not on file  Social Connections: Not on file  Intimate Partner Violence: Not on file   Current Outpatient Medications on File Prior to Visit  Medication Sig Dispense Refill   aspirin 81 MG EC tablet daily.     escitalopram (LEXAPRO) 20 MG tablet Take 1 tablet by mouth daily 90 tablet 1   escitalopram (LEXAPRO) 20 MG tablet Take 1 tablet (20 mg total) by mouth daily. 90 tablet 1   Glucagon 3 MG/DOSE POWD Place 3 mg into the nose once as needed for up to 1 dose. 1 each 11   glucose blood (ACCU-CHEK GUIDE) test strip Use as instructed 3 times  a day 200 each 3   GVOKE HYPOPEN 1-PACK 1 MG/0.2ML SOAJ Use as needed for hypoglycemia 0.2 mL PRN   Insulin Lispro-aabc (LYUMJEV) 100 UNIT/ML SOLN Inject up to 80 units a day in the insulin pump 70 mL 3   Lancets (ONETOUCH ULTRASOFT) lancets 1 each by Other route daily. To check blood sugars. 600 each 1   levothyroxine (SYNTHROID) 125 MCG tablet Take 1 tablet (125 mcg total) by mouth every morning. 90 tablet 3   methylPREDNISolone (MEDROL DOSEPAK) 4 MG TBPK tablet Take as directed on package 21 each 0   Multiple Vitamin (MULTIVITAMIN ADULT PO) Take by mouth.     nirmatrelvir & ritonavir (PAXLOVID, 300/100,) 20 x 150 MG & 10 x 100MG  TBPK Take 3 tablets by mouth 2 (two) times daily for 5 days 30 tablet 0   norgestrel-ethinyl estradiol (ELINEST) 0.3-30 MG-MCG tablet Take 1 tablet by mouth once a day 28 tablet 0   norgestrel-ethinyl estradiol (ELINEST) 0.3-30 MG-MCG tablet Take 1 tablet by mouth daily. 84 tablet 3   rosuvastatin (CRESTOR) 10 MG tablet Take 1 tablet by mouth daily 90 tablet 1   rosuvastatin (CRESTOR) 10 MG tablet Take 1 tablet (10 mg total) by mouth daily. 90 tablet 1   valACYclovir (VALTREX) 500 MG tablet Take 1 tablet (500 mg total) by mouth daily. 90 tablet 1   No current facility-administered medications on file prior to visit.   No Known Allergies   Family  History  Problem Relation Age of Onset   Metabolic syndrome Mother    Thyroid disease Mother    Hyperlipidemia Father    Hypertension Father    Hypertension Sister     + 11-01-1984 and great aunt with type 2 diabetes + Grandmother with heart problems + Grandfather with Addison's disease  PE: Ht 5\' 4"  (1.626 m)   Wt 175 lb (79.4 kg)   BMI 30.04 kg/m  Wt Readings from Last 3 Encounters:  09/10/22 175 lb (79.4 kg)  05/13/22 168 lb 12.8 oz (76.6 kg)  12/31/21 165 lb 12.8 oz (75.2 kg)   Constitutional: Slightly overweight, in NAD Eyes:  EOMI, no exophthalmos ENT: no neck masses, no cervical lymphadenopathy Cardiovascular: RRR, No MRG Respiratory: CTA B Musculoskeletal: no deformities Skin:no rashes Neurological: no tremor with outstretched hands  ASSESSMENT: 1. DM1, uncontrolled, without long-term complications, but with hyperglycemia  2.  Hashimoto's Hypothyroidism  PLAN:  1. Patient with longstanding, type 1 diabetes, on insulin pump therapy.  She has always been on the Medtronic insulin pump for the last 25 years, currently on the 780 G model.  We did discuss at last visits about the iLet pump and she appears to be interested in this.  However, at today's visit, we discussed again about the pump, since this is now on the market and after reviewing possible problems, she decided to continue with Medtronic. -At last visit, sugars are fluctuating within a fairly narrow range but with increasing blood sugars after lunch and occasionally after breakfast/coffee in the morning or after dinner.  She was getting correction boluses from the pump but she felt that these were either not exudations or dropping her sugars too low.  Her target was set for 140 for auto mode.we ended up strengthening her insulin to carb ratios with breakfast and lunch.  I also advised her to relax the target when out of the auto mode to avoid low blood sugars.  We did not change the basal rates. CGM  interpretation: -At today's visit,  we reviewed her CGM downloads: It appears that 77% of values are in target range (goal >70%), while 22% are higher than 180 (goal <25%), and 1% are lower than 70 (goal <4%).  The calculated average blood sugar is 153.  The projected HbA1c for the next 3 months (GMI) is 7.0%. -Reviewing the CGM trends, her sugars are mostly fluctuating within the target range, with only occasional hyperglycemic spikes, especially after lunch.  However, sugars are more fluctuating after this meal, so for now I did not recommend a change in insulin to carb ratio.  Reviewing her pump downloads, she appears to be doing multiple boluses to correct higher blood sugars despite the fact that the pump is also giving her correction boluses.  Therefore, I suggested an increase in her basal rates (since she is now getting 61% of the total daily dose of boluses and only 39% in basal insulin).  Otherwise, we can continue the same pump parameters. -We also discussed about management of restaurant meals, as she saw a significant increase in blood sugars after she wants to eat all his garden.  I recommended however weight goals this.  She cannot do these in the automatic mode and we discussed about possibly coming out of the automatic mode for 2 to 3 hours to be able to do an extended bolus. -She also recently started exercise and noticed a decrease in blood sugars afterwards.  Therefore, she interrupted the pump for the duration of exercise.  We discussed about possible strategies to help with hypoglycemia after exercise: Discussed about the importance of strength exercises and how they influence blood sugars Recommended to do a temporary basal rate of approximately 50% during exercise and possibly 30 minutes to an hour before She could possibly stop the pump but she needs to start this at least 30 minutes before exercise - I suggested to: Patient Instructions  Please use the following pump settings: -  basal rates: 12 am: 0.875 units/h 7 am: 0.800 8:30 am: 0.825 1 pm: 0.875 4 pm: 1.050 - ICR:  12 am-5 pm: 1:12 5 pm-12 am: 1:11 - target: 110-110 - ISF: 45 - Insulin on Board: 3h    Please continue Levothyroxine 125 cg daily.  Take the thyroid hormone every day, with water, at least 30 minutes before breakfast, separated by at least 4 hours from: - acid reflux medications - calcium - iron - multivitamins  Please return in 4 months.   - we checked her HbA1c: 7.0% (best in a long time) - advised to check sugars at different times of the day - 4x a day, rotating check times - advised for yearly eye exams >> she is UTD - return to clinic in 4 months  2.  Hypothyroidism - latest thyroid labs reviewed with pt. >> normal: Lab Results  Component Value Date   TSH 1.06 09/01/2021  - she continues on LT4 125 mcg daily - pt feels good on this dose. - we discussed about taking the thyroid hormone every day, with water, >30 minutes before breakfast, separated by >4 hours from acid reflux medications, calcium, iron, multivitamins. Pt. is taking it correctly. - will check thyroid tests today: TSH and fT4 - If labs are abnormal, she will need to return for repeat TFTs in 1.5 months  Component     Latest Ref Rng 09/10/2022  Hemoglobin A1C     4.0 - 5.6 % 7.0 !   TSH     0.35 - 5.50 uIU/mL 1.56  T4,Free(Direct)     0.60 - 1.60 ng/dL 0.88   Normal TFTs.  Philemon Kingdom, MD PhD [facility]

## 2022-09-13 ENCOUNTER — Ambulatory Visit: Payer: 59 | Admitting: Internal Medicine

## 2022-09-29 DIAGNOSIS — E1065 Type 1 diabetes mellitus with hyperglycemia: Secondary | ICD-10-CM | POA: Diagnosis not present

## 2022-09-29 DIAGNOSIS — E109 Type 1 diabetes mellitus without complications: Secondary | ICD-10-CM | POA: Diagnosis not present

## 2022-10-01 ENCOUNTER — Other Ambulatory Visit (HOSPITAL_COMMUNITY): Payer: Self-pay

## 2022-10-01 DIAGNOSIS — E109 Type 1 diabetes mellitus without complications: Secondary | ICD-10-CM | POA: Diagnosis not present

## 2022-10-01 DIAGNOSIS — E1065 Type 1 diabetes mellitus with hyperglycemia: Secondary | ICD-10-CM | POA: Diagnosis not present

## 2022-10-04 DIAGNOSIS — E1065 Type 1 diabetes mellitus with hyperglycemia: Secondary | ICD-10-CM | POA: Diagnosis not present

## 2022-10-04 DIAGNOSIS — E109 Type 1 diabetes mellitus without complications: Secondary | ICD-10-CM | POA: Diagnosis not present

## 2022-10-08 ENCOUNTER — Encounter (HOSPITAL_COMMUNITY): Payer: Self-pay

## 2022-10-08 ENCOUNTER — Other Ambulatory Visit (HOSPITAL_COMMUNITY): Payer: Self-pay

## 2022-10-13 ENCOUNTER — Other Ambulatory Visit: Payer: Self-pay

## 2022-10-14 ENCOUNTER — Other Ambulatory Visit: Payer: Self-pay

## 2022-10-18 ENCOUNTER — Other Ambulatory Visit: Payer: Self-pay

## 2022-10-23 ENCOUNTER — Other Ambulatory Visit (HOSPITAL_COMMUNITY): Payer: Self-pay

## 2022-10-24 DIAGNOSIS — M545 Low back pain, unspecified: Secondary | ICD-10-CM | POA: Diagnosis not present

## 2022-10-27 DIAGNOSIS — M9903 Segmental and somatic dysfunction of lumbar region: Secondary | ICD-10-CM | POA: Diagnosis not present

## 2022-10-27 DIAGNOSIS — M5432 Sciatica, left side: Secondary | ICD-10-CM | POA: Diagnosis not present

## 2022-10-28 ENCOUNTER — Other Ambulatory Visit (HOSPITAL_COMMUNITY): Payer: Self-pay

## 2022-10-28 DIAGNOSIS — M9903 Segmental and somatic dysfunction of lumbar region: Secondary | ICD-10-CM | POA: Diagnosis not present

## 2022-10-28 DIAGNOSIS — M5432 Sciatica, left side: Secondary | ICD-10-CM | POA: Diagnosis not present

## 2022-10-28 DIAGNOSIS — M545 Low back pain, unspecified: Secondary | ICD-10-CM | POA: Diagnosis not present

## 2022-10-28 MED ORDER — MELOXICAM 15 MG PO TABS
15.0000 mg | ORAL_TABLET | Freq: Every day | ORAL | 0 refills | Status: AC
  Filled 2022-10-28: qty 4, 4d supply, fill #0

## 2022-11-01 DIAGNOSIS — M5432 Sciatica, left side: Secondary | ICD-10-CM | POA: Diagnosis not present

## 2022-11-01 DIAGNOSIS — M9903 Segmental and somatic dysfunction of lumbar region: Secondary | ICD-10-CM | POA: Diagnosis not present

## 2022-11-02 DIAGNOSIS — M9903 Segmental and somatic dysfunction of lumbar region: Secondary | ICD-10-CM | POA: Diagnosis not present

## 2022-11-02 DIAGNOSIS — M5432 Sciatica, left side: Secondary | ICD-10-CM | POA: Diagnosis not present

## 2022-11-03 DIAGNOSIS — M9903 Segmental and somatic dysfunction of lumbar region: Secondary | ICD-10-CM | POA: Diagnosis not present

## 2022-11-03 DIAGNOSIS — M5432 Sciatica, left side: Secondary | ICD-10-CM | POA: Diagnosis not present

## 2022-11-04 DIAGNOSIS — M9903 Segmental and somatic dysfunction of lumbar region: Secondary | ICD-10-CM | POA: Diagnosis not present

## 2022-11-04 DIAGNOSIS — M5432 Sciatica, left side: Secondary | ICD-10-CM | POA: Diagnosis not present

## 2022-11-15 DIAGNOSIS — M9903 Segmental and somatic dysfunction of lumbar region: Secondary | ICD-10-CM | POA: Diagnosis not present

## 2022-11-15 DIAGNOSIS — M5432 Sciatica, left side: Secondary | ICD-10-CM | POA: Diagnosis not present

## 2022-11-22 ENCOUNTER — Other Ambulatory Visit: Payer: Self-pay

## 2022-11-22 ENCOUNTER — Other Ambulatory Visit (HOSPITAL_COMMUNITY): Payer: Self-pay

## 2022-11-22 DIAGNOSIS — M5432 Sciatica, left side: Secondary | ICD-10-CM | POA: Diagnosis not present

## 2022-11-22 DIAGNOSIS — M9903 Segmental and somatic dysfunction of lumbar region: Secondary | ICD-10-CM | POA: Diagnosis not present

## 2022-11-23 ENCOUNTER — Other Ambulatory Visit (HOSPITAL_COMMUNITY): Payer: Self-pay

## 2022-11-30 DIAGNOSIS — M5432 Sciatica, left side: Secondary | ICD-10-CM | POA: Diagnosis not present

## 2022-11-30 DIAGNOSIS — M9903 Segmental and somatic dysfunction of lumbar region: Secondary | ICD-10-CM | POA: Diagnosis not present

## 2022-12-02 ENCOUNTER — Other Ambulatory Visit: Payer: Self-pay

## 2022-12-09 DIAGNOSIS — M9903 Segmental and somatic dysfunction of lumbar region: Secondary | ICD-10-CM | POA: Diagnosis not present

## 2022-12-09 DIAGNOSIS — M5432 Sciatica, left side: Secondary | ICD-10-CM | POA: Diagnosis not present

## 2022-12-16 ENCOUNTER — Telehealth: Payer: Self-pay

## 2022-12-16 DIAGNOSIS — E109 Type 1 diabetes mellitus without complications: Secondary | ICD-10-CM | POA: Diagnosis not present

## 2022-12-16 DIAGNOSIS — M5432 Sciatica, left side: Secondary | ICD-10-CM | POA: Diagnosis not present

## 2022-12-16 DIAGNOSIS — E1065 Type 1 diabetes mellitus with hyperglycemia: Secondary | ICD-10-CM | POA: Diagnosis not present

## 2022-12-16 DIAGNOSIS — M9903 Segmental and somatic dysfunction of lumbar region: Secondary | ICD-10-CM | POA: Diagnosis not present

## 2022-12-16 NOTE — Telephone Encounter (Signed)
Inbound fax from DME supplier requesting form be completed and faxed with clinical notes. DME supplies ordered via [employer] through online portal. Medtronic pump resupply and Glucose Meter

## 2022-12-20 DIAGNOSIS — M5432 Sciatica, left side: Secondary | ICD-10-CM | POA: Diagnosis not present

## 2022-12-20 DIAGNOSIS — M9903 Segmental and somatic dysfunction of lumbar region: Secondary | ICD-10-CM | POA: Diagnosis not present

## 2023-01-04 DIAGNOSIS — M5432 Sciatica, left side: Secondary | ICD-10-CM | POA: Diagnosis not present

## 2023-01-04 DIAGNOSIS — M9903 Segmental and somatic dysfunction of lumbar region: Secondary | ICD-10-CM | POA: Diagnosis not present

## 2023-01-06 DIAGNOSIS — E109 Type 1 diabetes mellitus without complications: Secondary | ICD-10-CM | POA: Diagnosis not present

## 2023-01-06 DIAGNOSIS — E1065 Type 1 diabetes mellitus with hyperglycemia: Secondary | ICD-10-CM | POA: Diagnosis not present

## 2023-01-16 ENCOUNTER — Other Ambulatory Visit (HOSPITAL_COMMUNITY): Payer: Self-pay

## 2023-01-17 ENCOUNTER — Other Ambulatory Visit (HOSPITAL_COMMUNITY): Payer: Self-pay

## 2023-01-17 MED ORDER — ROSUVASTATIN CALCIUM 10 MG PO TABS
10.0000 mg | ORAL_TABLET | Freq: Every day | ORAL | 1 refills | Status: DC
  Filled 2023-01-17 – 2023-01-18 (×2): qty 90, 90d supply, fill #0
  Filled 2023-04-09: qty 90, 90d supply, fill #1

## 2023-01-18 ENCOUNTER — Other Ambulatory Visit: Payer: Self-pay

## 2023-01-18 ENCOUNTER — Other Ambulatory Visit (HOSPITAL_COMMUNITY): Payer: Self-pay

## 2023-01-18 MED ORDER — ESCITALOPRAM OXALATE 20 MG PO TABS
20.0000 mg | ORAL_TABLET | Freq: Every day | ORAL | 1 refills | Status: DC
  Filled 2023-01-18 (×2): qty 90, 90d supply, fill #0
  Filled 2023-04-09: qty 90, 90d supply, fill #1

## 2023-01-31 DIAGNOSIS — M5432 Sciatica, left side: Secondary | ICD-10-CM | POA: Diagnosis not present

## 2023-01-31 DIAGNOSIS — M9903 Segmental and somatic dysfunction of lumbar region: Secondary | ICD-10-CM | POA: Diagnosis not present

## 2023-02-04 ENCOUNTER — Other Ambulatory Visit (HOSPITAL_COMMUNITY): Payer: Self-pay

## 2023-02-04 ENCOUNTER — Other Ambulatory Visit: Payer: Self-pay

## 2023-02-09 ENCOUNTER — Encounter: Payer: Self-pay | Admitting: Internal Medicine

## 2023-02-09 ENCOUNTER — Ambulatory Visit: Payer: Commercial Managed Care - PPO | Admitting: Internal Medicine

## 2023-02-09 VITALS — BP 120/76 | HR 81 | Ht 64.0 in | Wt 175.0 lb

## 2023-02-09 DIAGNOSIS — E1065 Type 1 diabetes mellitus with hyperglycemia: Secondary | ICD-10-CM | POA: Diagnosis not present

## 2023-02-09 DIAGNOSIS — Z794 Long term (current) use of insulin: Secondary | ICD-10-CM | POA: Diagnosis not present

## 2023-02-09 DIAGNOSIS — E119 Type 2 diabetes mellitus without complications: Secondary | ICD-10-CM

## 2023-02-09 DIAGNOSIS — E063 Autoimmune thyroiditis: Secondary | ICD-10-CM | POA: Diagnosis not present

## 2023-02-09 DIAGNOSIS — E038 Other specified hypothyroidism: Secondary | ICD-10-CM

## 2023-02-09 LAB — MICROALBUMIN / CREATININE URINE RATIO
Creatinine,U: 52 mg/dL
Microalb Creat Ratio: 1.3 mg/g (ref 0.0–30.0)
Microalb, Ur: <0.7 mg/dL (ref 0.0–1.9)

## 2023-02-09 LAB — HEMOGLOBIN A1C: Hemoglobin A1C: 7.3

## 2023-02-09 NOTE — Patient Instructions (Signed)
Please use the following pump settings: - basal rates: 12 am: 0.875 units/h 2 am: 0.875 >> 0.825 7 am: 0.800 8:30 am: 0.825 1 pm: 0.875 4 pm: 1.050 - ICR:  12 am-5 pm: 1:12 >> 1:11 (try 1:10 if sugars still high after meals) 5 pm-12 am: 1:11  - target: 110-110 - ISF: 45 - Insulin on Board: 3h    Please continue Levothyroxine 125 mcg daily.  Take the thyroid hormone every day, with water, at least 30 minutes before breakfast, separated by at least 4 hours from: - acid reflux medications - calcium - iron - multivitamins  Please return in 4 months.

## 2023-02-09 NOTE — Progress Notes (Signed)
Patient ID: [name], female   DOB: 15-Nov-1975, 47 y.o.   MRN: [id number]   HPI: Katrina Brooks is a 47 y.o.-year-old female, initially referred by her PCP, Dr. Hermelinda Medicus, returning for follow-up for DM1, diagnosed as a child (50 y/o), uncontrolled, without complications and Hashimoto's hypothyroidism.  She moved from [place] 2021.  Last visit 5 months ago.  Interim history: No increased urination, blurry vision, nausea, chest pain. She previously lost 20 pounds (last year) using the "Lose it" app and counting calories.  Weight loss slowed down afterwards.  Before last visit, she gained several pounds.  She restarted going to the gym >> hurt back >> stopped. She had steroids, saw a Land. She did not return to the gym. Weight is stable since last visit.  Reviewed HbA1c levels: Lab Results  Component Value Date   HGBA1C 7.0 (A) 09/10/2022   HGBA1C 7.7 (A) 05/13/2022   HGBA1C 7.8 (A) 12/31/2021   HGBA1C 7.3 (A) 09/01/2021   HGBA1C 7.7 (A) 04/30/2021   HGBA1C 7.5 (A) 12/23/2020   HGBA1C 7.7 (A) 09/10/2020   HGBA1C 8.1 (A) 06/02/2020    Previously:   Insulin pump:  -since 47 y/o -only had Medtronic pumps -Medtronic 670 G - since 2018 - with clear ring >> got a replacement -Medtronic 770G - since 01-09/2021 -Medtronic 780G - since 04/2022  CGM: -Medtronic Guardian  Insulin: -Previously on NovoLog - [place] approved -tried Apidra >> did not like it -tried Humalog >> slower onset, was not controlling her sugars very well -Now Lyumjev  Supplies: -prev. Medtronic  -[employer] [employer] now  Pump settings: - basal rates: 12 am: 0.875 units/h 7 am: 0.800 8:30 am: 0.825 1 pm: 0.875 4 pm: 1.050 - ICR:  12 am-5 pm: 1:13 >> 1:12  5 pm-12 am: 1:11 - target: 95-110 >> 110-110 - ISF: 45 - Insulin on Board: 3h - bolus wizard: on - extended bolusing: not using - changes infusion site: q 5.5 >> q3-4 days TDD from basal insulin: 56% (19.9 units) >> 49% >> 63% >> 45% >> 44% TDD from  bolus insulin: 44% (15.5 units) >> 51% >> 37% >> 55% >> 56% Total daily dose: 40-70 units daily Sensor alarms: 60-200 Meter: Contour Next  She checks her sugars more than 4 times a day with her CGM:  Previously:  Previously:  Lowest sugar was <40 (yardwork) ... >> 60s >> 40s; she has hypoglycemia awareness in the 60s.  She has a glucagon kit at home.  No previous hypoglycemia admission. Highest sugar was 500 (steroids for Plantar fasciitis) >> .Marland Kitchen. 306 >> 300s.  No previous DKA admissions.  Pt's meals are: - Breakfast: Coffee, [place] sausage sticks, cottage cheese, fruit; skips when not working >> mostly coffee, skips - Lunch: [employer] meal or sandwich, fruit - Dinner: Meat and vegetables or seafood - Snacks: several, various She works 4 days a week, 8 AM to 5:30 PM (at [employer])  -No CKD but history of MAU, last BUN/creatinine:  Comp Metabolic Panel Reviewed date:05/29/2022 07:52:39 AM Interpretation: Performing Lab: Notes/Report: Testing Performed at: [facility], [address], [address], [place], [place] [address]  Glucose 127 70-99 mg/dL  BUN 8 1-91 mg/dL  Creatinine 4.78 2.95-6.21 mg/dl  [id number] 97 >69 calc  Sodium 138 136-145 mmol/L  Potassium 4.9 3.5-5.5 mmol/L  Chloride 105 98-107 mmol/L  CO2 28 22-32 mmol/L  Anion Gap 10.2 6.0-20.0 mmol/L  Calcium 9.1 8.6-10.3 mg/dL  CA-corrected 6.29 5.28-41.32 mg/dL  Protein, Total 6.2 4.4-0.1 g/dL  Albumin 4.1 3.4-4.8 g/dL  TBIL 0.5 1.6-1.0 mg/dL  ALP 62 96-045 U/L  AST 16 0-39 U/L  ALT 11 0-52 U/L   Lab Results  Component Value Date   BUN 9 09/01/2021   BUN 9 09/10/2020   CREATININE 0.72 09/01/2021   CREATININE 0.72 09/10/2020  11/02/2019:13/0.91, GFR 78, glucose 159, protein to creatinine ratio in 24-hour urine 0.097 (< OR = 0.114)  -+ HL; last set of lipids: 05/13/2022: 130/52/58/60 Lab Results  Component Value Date   CHOL 129 09/01/2021   HDL 58.50 09/01/2021   LDLCALC 56 09/01/2021   TRIG 72.0 09/01/2021    CHOLHDL 2 09/01/2021  11/02/2019: 124/62/66/54 On Crestor 10 per cardiology. CAC was 0 in 07/2019. On ASA 81.  - last eye exam: Reviewed date:05/27/2022 01:36:35 PM Interpretation:No diabetic retinopathy   - No numbness and tingling in her feet.  Last foot exam 12/2021.  Pt has FH of DM2 in grandfather, great aunt. Metabolic syndrome in mother.  Hypothyroidism: -Due to Hashimoto's thyroiditis per review of records from previous endocrinologist  Pt is on levothyroxine 125 mcg daily, taken: - in am - fasting - at least 30 min from b'fast - no calcium - no iron - + multivitamins at night - no PPIs - not on Biotin  Reviewed TSH levels: Lab Results  Component Value Date   TSH 1.56 09/10/2022   TSH 1.06 09/01/2021   TSH 1.44 10/29/2020   TSH 5.12 (H) 09/10/2020  11/02/2019: TSH 1.07 (0.4-4.5), free T4 1.2 (0.8-1.8)  She is on OCPs for irregular menses.  In [place], she was working in the [employer].  ROS: + See HPI  I reviewed pt's medications, allergies, PMH, social hx, family hx, and changes were documented in the history of present illness. Otherwise, unchanged from my initial visit note.  Past Surgical History:  Procedure Laterality Date   BUNIONECTOMY Right 2016   CYST REMOVAL LEG Bilateral    Bilateral feet-2006 and 2016   TEAR DUCT PROBING     As a child   TONSILLECTOMY  2001   Social History   Socioeconomic History   Marital status: Married    Spouse name: Not on file   Number of children: 0   Years of education: Not on file   Highest education level: Not on file  Occupational History   Occupation: Charity fundraiser at [employer]  Tobacco Use   Smoking status: Never   Smokeless tobacco: Never  Substance and Sexual Activity   Alcohol use: Not Currently   Drug use: Never   Sexual activity: Not on file  Other Topics Concern   Not on file  Social History Narrative   Not on file   Social Determinants of Health   Financial Resource Strain: Not on file   Food Insecurity: Not on file  Transportation Needs: Not on file  Physical Activity: Not on file  Stress: Not on file  Social Connections: Not on file  Intimate Partner Violence: Not on file   Current Outpatient Medications on File Prior to Visit  Medication Sig Dispense Refill   aspirin 81 MG EC tablet daily.     escitalopram (LEXAPRO) 20 MG tablet Take 1 tablet by mouth daily 90 tablet 1   escitalopram (LEXAPRO) 20 MG tablet Take 1 tablet (20 mg total) by mouth daily. 90 tablet 1   Glucagon 3 MG/DOSE POWD Place 3 mg into the nose once as needed for up to 1 dose. 1 each 11   glucose blood (ACCU-CHEK  GUIDE) test strip Use as instructed 3 times a day 200 each 3   GVOKE HYPOPEN 1-PACK 1 MG/0.2ML SOAJ Use as needed for hypoglycemia 0.2 mL PRN   Insulin Lispro-aabc (LYUMJEV) 100 UNIT/ML SOLN Inject up to 80 units a day in the insulin pump 70 mL 3   Lancets (ONETOUCH ULTRASOFT) lancets 1 each by Other route daily. To check blood sugars. 600 each 1   levothyroxine (SYNTHROID) 125 MCG tablet Take 1 tablet (125 mcg total) by mouth every morning. 90 tablet 3   Multiple Vitamin (MULTIVITAMIN ADULT PO) Take by mouth.     norgestrel-ethinyl estradiol (ELINEST) 0.3-30 MG-MCG tablet Take 1 tablet by mouth once a day 28 tablet 0   norgestrel-ethinyl estradiol (ELINEST) 0.3-30 MG-MCG tablet Take 1 tablet by mouth daily. 84 tablet 3   rosuvastatin (CRESTOR) 10 MG tablet Take 1 tablet by mouth daily 90 tablet 1   rosuvastatin (CRESTOR) 10 MG tablet Take 1 tablet (10 mg total) by mouth daily. 90 tablet 1   valACYclovir (VALTREX) 500 MG tablet Take 1 tablet (500 mg total) by mouth daily. (Patient taking differently: Take 500 mg by mouth as needed.) 90 tablet 1   No current facility-administered medications on file prior to visit.   No Known Allergies   Family History  Problem Relation Age of Onset   Metabolic syndrome Mother    Thyroid disease Mother    Hyperlipidemia Father    Hypertension Father     Hypertension Sister     + Emelia Loron and great aunt with type 2 diabetes + Grandmother with heart problems + Grandfather with Addison's disease  PE: BP 120/76   Pulse 81   Ht 5\' 4"  (1.626 m)   Wt 175 lb (79.4 kg)   SpO2 98%   BMI 30.04 kg/m  Wt Readings from Last 3 Encounters:  02/09/23 175 lb (79.4 kg)  09/10/22 175 lb (79.4 kg)  05/13/22 168 lb 12.8 oz (76.6 kg)   Constitutional: Slightly overweight, in NAD Eyes:  EOMI, no exophthalmos ENT: no neck masses, no cervical lymphadenopathy Cardiovascular: RRR, No MRG Respiratory: CTA B Musculoskeletal: no deformities Skin:no rashes Neurological: no tremor with outstretched hands Diabetic Foot Exam - Simple   Simple Foot Form Diabetic Foot exam was performed with the following findings: Yes 02/09/2023  8:55 AM  Visual Inspection No deformities, no ulcerations, no other skin breakdown bilaterally: Yes Sensation Testing Intact to touch and monofilament testing bilaterally: Yes Pulse Check Posterior Tibialis and Dorsalis pulse intact bilaterally: Yes Comments    ASSESSMENT: 1. DM1, uncontrolled, without long-term complications, but with hyperglycemia  2.  Hashimoto's Hypothyroidism  PLAN:  1. Patient with longstanding, type 1 diabetes, on insulin pump therapy.  She has always been on the Medtronic insulin pump for the last 25 years, currently on the 780 G model.  We discussed at last visit about other forms but after reviewing possible problems, she decided to continue with Medtronic.  At last visit, reviewing the CGM trends, sugars were mostly fluctuating within the target range with only occasional hyperglycemic spikes, especially after lunch.  However, sugars were more fluctuating after this meal so I did not recommend a change in the insulin to carb ratio.  She was doing multiple boluses to correct higher blood sugars despite the fact that the pump was also giving her correction boluses.  Therefore, I suggested an increase in  her basal rates but did not change the rest of the settings.  We discussed about changes in blood  sugars expected with exercise.  She did notice a decrease in blood sugars after exercise.  She was interacting the plan for the duration of exercise and we discussed about possible strategies to help with hypoglycemia after exercise including doing the temporary basal rate of approximately 50% during exercise and possibly 30 minutes-an hour after; also, stopping the pump if absolutely needed, but doing so at least 30 minutes before exercise.  We discussed about the importance of strength exercises and how they influence blood sugars. CGM interpretation: -At today's visit, we reviewed her CGM downloads: It appears that 72% of values are in target range (goal >70%), while 26% are higher than 180 (goal <25%), and 2% are lower than 70 (goal <4%).  The calculated average blood sugar is 149.  The projected HbA1c for the next 3 months (GMI) is 6.9%. -Reviewing the CGM trends, sugars appear to be slightly higher than before, with a trend of higher blood sugars overnight, corresponding to her craving food and eating at night, I decrease after approximately 4 AM with a nadir around 7 AM and then increasing blood sugars after meals. -She is planning to stop eating at night, but since the sugars are trending down overnight even without bolusing in the middle of the night, I did recommend to reduce her basal rates after 2 AM.  To improve her blood sugars after meals, I recommended to strengthen her insulin to carb ratios.  She does have a decrease in blood sugars after approximately 10 PM so we did not change her insulin to carb ratio with dinner. -We discussed about potentially using extended boluses more, but she will need to come out of the auto mode to be able to use this - I suggested to: Patient Instructions  Please use the following pump settings: - basal rates: 12 am: 0.875 units/h 2 am: 0.875 >> 0.825 7 am:  0.800 8:30 am: 0.825 1 pm: 0.875 4 pm: 1.050 - ICR:  12 am-5 pm: 1:12 >> 1:11 (try 1:10 if sugars still high after meals) 5 pm-12 am: 1:11  - target: 110-110 - ISF: 45 - Insulin on Board: 3h    Please continue Levothyroxine 125 mcg daily.  Take the thyroid hormone every day, with water, at least 30 minutes before breakfast, separated by at least 4 hours from: - acid reflux medications - calcium - iron - multivitamins  Please return in 4 months.   - we checked her HbA1c: 7.3% (higher) - advised to check sugars at different times of the day - 4x a day, rotating check times - advised for yearly eye exams >> she is UTD - will check an ACR today - return to clinic in 4 months  2.  Hypothyroidism - latest thyroid labs reviewed with pt. >> normal: Lab Results  Component Value Date   TSH 1.56 09/10/2022  - she continues on LT4 125 mcg daily - pt feels good on this dose. - we discussed about taking the thyroid hormone every day, with water, >30 minutes before breakfast, separated by >4 hours from acid reflux medications, calcium, iron, multivitamins. Pt. is taking it correctly.  Carlus Pavlov, MD PhD [facility] Endocrin[facility]

## 2023-02-10 ENCOUNTER — Encounter: Payer: Self-pay | Admitting: Internal Medicine

## 2023-02-14 ENCOUNTER — Encounter: Payer: Self-pay | Admitting: Internal Medicine

## 2023-02-15 ENCOUNTER — Other Ambulatory Visit: Payer: Self-pay

## 2023-02-15 ENCOUNTER — Other Ambulatory Visit: Payer: Self-pay | Admitting: Internal Medicine

## 2023-02-15 ENCOUNTER — Other Ambulatory Visit (HOSPITAL_COMMUNITY): Payer: Self-pay

## 2023-02-15 MED ORDER — GVOKE HYPOPEN 1-PACK 1 MG/0.2ML ~~LOC~~ SOAJ
SUBCUTANEOUS | 99 refills | Status: DC
  Filled 2023-02-15: qty 0.2, 30d supply, fill #0

## 2023-02-21 ENCOUNTER — Other Ambulatory Visit (HOSPITAL_COMMUNITY): Payer: Self-pay

## 2023-02-28 DIAGNOSIS — M5432 Sciatica, left side: Secondary | ICD-10-CM | POA: Diagnosis not present

## 2023-02-28 DIAGNOSIS — M9903 Segmental and somatic dysfunction of lumbar region: Secondary | ICD-10-CM | POA: Diagnosis not present

## 2023-03-03 DIAGNOSIS — E1065 Type 1 diabetes mellitus with hyperglycemia: Secondary | ICD-10-CM | POA: Diagnosis not present

## 2023-03-03 DIAGNOSIS — E109 Type 1 diabetes mellitus without complications: Secondary | ICD-10-CM | POA: Diagnosis not present

## 2023-03-17 ENCOUNTER — Other Ambulatory Visit: Payer: Self-pay | Admitting: Family Medicine

## 2023-03-17 DIAGNOSIS — Z1231 Encounter for screening mammogram for malignant neoplasm of breast: Secondary | ICD-10-CM

## 2023-03-21 DIAGNOSIS — M5432 Sciatica, left side: Secondary | ICD-10-CM | POA: Diagnosis not present

## 2023-03-21 DIAGNOSIS — M9903 Segmental and somatic dysfunction of lumbar region: Secondary | ICD-10-CM | POA: Diagnosis not present

## 2023-03-28 ENCOUNTER — Encounter: Payer: Self-pay | Admitting: Internal Medicine

## 2023-04-09 ENCOUNTER — Other Ambulatory Visit (HOSPITAL_COMMUNITY): Payer: Self-pay

## 2023-04-09 ENCOUNTER — Other Ambulatory Visit (HOSPITAL_BASED_OUTPATIENT_CLINIC_OR_DEPARTMENT_OTHER): Payer: Self-pay

## 2023-04-12 ENCOUNTER — Other Ambulatory Visit (HOSPITAL_COMMUNITY): Payer: Self-pay

## 2023-04-12 ENCOUNTER — Other Ambulatory Visit: Payer: Self-pay

## 2023-04-12 DIAGNOSIS — E109 Type 1 diabetes mellitus without complications: Secondary | ICD-10-CM | POA: Diagnosis not present

## 2023-04-12 DIAGNOSIS — Z794 Long term (current) use of insulin: Secondary | ICD-10-CM | POA: Diagnosis not present

## 2023-04-12 LAB — HM DIABETES EYE EXAM

## 2023-04-12 MED ORDER — ELINEST 0.3-30 MG-MCG PO TABS
1.0000 | ORAL_TABLET | Freq: Every day | ORAL | 0 refills | Status: DC
  Filled 2023-04-12: qty 84, 84d supply, fill #0

## 2023-04-13 ENCOUNTER — Encounter: Payer: Self-pay | Admitting: Internal Medicine

## 2023-04-13 DIAGNOSIS — M5432 Sciatica, left side: Secondary | ICD-10-CM | POA: Diagnosis not present

## 2023-04-13 DIAGNOSIS — M9903 Segmental and somatic dysfunction of lumbar region: Secondary | ICD-10-CM | POA: Diagnosis not present

## 2023-04-14 ENCOUNTER — Telehealth: Payer: Self-pay | Admitting: *Deleted

## 2023-04-14 NOTE — Telephone Encounter (Signed)
Please obtain insulin pump instructions for colonoscopy and prepping the day before. Thank you

## 2023-04-20 ENCOUNTER — Encounter (HOSPITAL_COMMUNITY): Payer: Self-pay

## 2023-04-20 ENCOUNTER — Ambulatory Visit (AMBULATORY_SURGERY_CENTER): Payer: Commercial Managed Care - PPO | Admitting: *Deleted

## 2023-04-20 ENCOUNTER — Telehealth: Payer: Self-pay | Admitting: *Deleted

## 2023-04-20 ENCOUNTER — Other Ambulatory Visit (HOSPITAL_COMMUNITY): Payer: Self-pay

## 2023-04-20 ENCOUNTER — Other Ambulatory Visit: Payer: Self-pay

## 2023-04-20 VITALS — Ht 64.0 in | Wt 170.0 lb

## 2023-04-20 DIAGNOSIS — Z800 Family history of malignant neoplasm of digestive organs: Secondary | ICD-10-CM

## 2023-04-20 DIAGNOSIS — Z1211 Encounter for screening for malignant neoplasm of colon: Secondary | ICD-10-CM

## 2023-04-20 MED ORDER — NA SULFATE-K SULFATE-MG SULF 17.5-3.13-1.6 GM/177ML PO SOLN
1.0000 | Freq: Once | ORAL | 0 refills | Status: AC
Start: 2023-04-20 — End: 2023-04-22
  Filled 2023-04-20: qty 354, 1d supply, fill #0

## 2023-04-20 NOTE — Telephone Encounter (Signed)
Request for insulin pump instructions for the procedure and the prep sent to Dr Elvera Lennox.

## 2023-04-20 NOTE — Progress Notes (Signed)
Pt's name and DOB verified at the beginning of the pre-visit.  Pt denies any difficulty with ambulating,sitting, laying down or rolling side to side Gave both [facility] main # and MD on call # prior to instructions.  No egg or soy allergy known to patient  No issues known to pt with past sedation with any surgeries or procedures Pt denies having issues being intubated Pt has no issues moving head neck or swallowing No FH of Malignant Hyperthermia Pt is not on diet pills Pt is not on home 02  Pt is not on blood thinners  Pt denies issues with constipation  Pt is not on dialysis Pt denise any abnormal heart rhythms  Pt denies any upcoming cardiac testing Pt encouraged to use to use Singlecare or Goodrx to reduce cost  Patient's chart reviewed by Cathlyn Parsons CNRA prior to pre-visit and patient appropriate for the [facility].  Pre-visit completed and red dot placed by patient's name on their procedure day (on provider's schedule).  . Visit by phone Pt states weight is 29.18  170 lb Instructed pt why it is important to and  to call if they have any changes in health or new medications. Directed them to the # given and on instructions.   Pt states they will.  Instructions reviewed with pt and pt states understanding. Instructed to review again prior to procedure. Pt states they will.  Instructions sent by mail with coupon and by my chart

## 2023-04-21 NOTE — Telephone Encounter (Signed)
Called the patient. No answer. Left a message on her voicemail advising her specific instructions for the insulin pump will be sent to her through [facility] CHART. Also instructed her to call me if she is unable to access the instructions.   Carlus Pavlov, MD  Cathan Gearin, Austin Miles, LPN Wynelle Cleveland, For patients on the pump, I usually recommend to continue the same basal rates, but not to bolus for meals unless they are eating regular meals and only do corrections if the sugars increase above 180.  They can resume taking boluses for meals when they start eating after the colonoscopy. Sincerely, Carlus Pavlov MD

## 2023-04-29 ENCOUNTER — Ambulatory Visit
Admission: RE | Admit: 2023-04-29 | Discharge: 2023-04-29 | Disposition: A | Discharge status: Home / Self Care | Payer: Commercial Managed Care - PPO | Source: Ambulatory Visit | Attending: Family Medicine | Admitting: Family Medicine

## 2023-04-29 DIAGNOSIS — Z1231 Encounter for screening mammogram for malignant neoplasm of breast: Secondary | ICD-10-CM

## 2023-05-03 ENCOUNTER — Other Ambulatory Visit: Payer: Self-pay | Admitting: Family Medicine

## 2023-05-03 DIAGNOSIS — R928 Other abnormal and inconclusive findings on diagnostic imaging of breast: Secondary | ICD-10-CM

## 2023-05-05 DIAGNOSIS — M9903 Segmental and somatic dysfunction of lumbar region: Secondary | ICD-10-CM | POA: Diagnosis not present

## 2023-05-05 DIAGNOSIS — M5432 Sciatica, left side: Secondary | ICD-10-CM | POA: Diagnosis not present

## 2023-05-09 ENCOUNTER — Encounter: Payer: Self-pay | Admitting: Internal Medicine

## 2023-05-12 ENCOUNTER — Other Ambulatory Visit: Payer: Self-pay | Admitting: Family Medicine

## 2023-05-12 DIAGNOSIS — R928 Other abnormal and inconclusive findings on diagnostic imaging of breast: Secondary | ICD-10-CM

## 2023-05-13 ENCOUNTER — Ambulatory Visit
Admission: RE | Admit: 2023-05-13 | Discharge: 2023-05-13 | Disposition: A | Discharge status: Home / Self Care | Payer: Commercial Managed Care - PPO | Source: Ambulatory Visit | Attending: Family Medicine | Admitting: Family Medicine

## 2023-05-13 ENCOUNTER — Other Ambulatory Visit: Payer: Self-pay | Admitting: Family Medicine

## 2023-05-13 DIAGNOSIS — R928 Other abnormal and inconclusive findings on diagnostic imaging of breast: Secondary | ICD-10-CM

## 2023-05-13 DIAGNOSIS — N6325 Unspecified lump in the left breast, overlapping quadrants: Secondary | ICD-10-CM | POA: Diagnosis not present

## 2023-05-15 ENCOUNTER — Other Ambulatory Visit: Payer: Self-pay | Admitting: Internal Medicine

## 2023-05-15 DIAGNOSIS — E063 Autoimmune thyroiditis: Secondary | ICD-10-CM

## 2023-05-16 ENCOUNTER — Other Ambulatory Visit: Payer: Self-pay

## 2023-05-16 MED ORDER — LYUMJEV 100 UNIT/ML IJ SOLN
INTRAMUSCULAR | 3 refills | Status: DC
  Filled 2023-05-16: qty 70, 88d supply, fill #0
  Filled 2023-08-10 – 2023-08-15 (×5): qty 70, 88d supply, fill #1
  Filled 2023-12-15: qty 70, 88d supply, fill #2

## 2023-05-16 MED ORDER — LEVOTHYROXINE SODIUM 125 MCG PO TABS
125.0000 ug | ORAL_TABLET | Freq: Every morning | ORAL | 3 refills | Status: DC
Start: 2023-05-16 — End: 2024-02-21
  Filled 2023-05-16: qty 90, 90d supply, fill #0
  Filled 2023-08-10: qty 90, 90d supply, fill #1
  Filled 2023-11-08: qty 90, 90d supply, fill #2

## 2023-05-17 ENCOUNTER — Encounter: Payer: Self-pay | Admitting: Internal Medicine

## 2023-05-17 ENCOUNTER — Ambulatory Visit: Payer: Commercial Managed Care - PPO | Admitting: Internal Medicine

## 2023-05-17 VITALS — BP 106/73 | HR 73 | Temp 98.1°F | Resp 14 | Ht 64.0 in | Wt 170.0 lb

## 2023-05-17 DIAGNOSIS — Z1211 Encounter for screening for malignant neoplasm of colon: Secondary | ICD-10-CM | POA: Diagnosis not present

## 2023-05-17 DIAGNOSIS — Z800 Family history of malignant neoplasm of digestive organs: Secondary | ICD-10-CM

## 2023-05-17 DIAGNOSIS — E119 Type 2 diabetes mellitus without complications: Secondary | ICD-10-CM | POA: Diagnosis not present

## 2023-05-17 DIAGNOSIS — D123 Benign neoplasm of transverse colon: Secondary | ICD-10-CM | POA: Diagnosis not present

## 2023-05-17 DIAGNOSIS — F32A Depression, unspecified: Secondary | ICD-10-CM | POA: Diagnosis not present

## 2023-05-17 DIAGNOSIS — K6389 Other specified diseases of intestine: Secondary | ICD-10-CM | POA: Diagnosis not present

## 2023-05-17 DIAGNOSIS — K635 Polyp of colon: Secondary | ICD-10-CM | POA: Diagnosis not present

## 2023-05-17 MED ORDER — SODIUM CHLORIDE 0.9 % IV SOLN: 500.0000 mL | Freq: Once | INTRAVENOUS | Status: DC

## 2023-05-17 NOTE — Op Note (Signed)
[facility] Patient Name: Katrina Brooks Procedure Date: 05/17/2023 8:04 AM MRN: [id number] Endoscopist: Madelyn Brunner [place] , , [id number] Age: 47 Referring MD:  Date of Birth: [birth year] Gender: Female Account #: [id number] Procedure:                Colonoscopy Indications:              Screening for colorectal malignant neoplasm, This                            is the patient's first colonoscopy Medicines:                Monitored Anesthesia Care Procedure:                Pre-Anesthesia Assessment:                           - Prior to the procedure, a History and Physical                            was performed, and patient medications and                            allergies were reviewed. The patient's tolerance of                            previous anesthesia was also reviewed. The risks                            and benefits of the procedure and the sedation                            options and risks were discussed with the patient.                            All questions were answered, and informed consent                            was obtained. Prior Anticoagulants: The patient has                            taken no anticoagulant or antiplatelet agents. ASA                            Grade Assessment: II - A patient with mild systemic                            disease. After reviewing the risks and benefits,                            the patient was deemed in satisfactory condition to                            undergo the procedure.  After obtaining informed consent, the colonoscope                            was passed under direct vision. Throughout the                            procedure, the patient's blood pressure, pulse, and                            oxygen saturations were monitored continuously. The                            Olympus CF-HQ190L [id number]) Colonoscope was                            introduced through the  anus and advanced to the the                            terminal ileum. The colonoscopy was performed                            without difficulty. The patient tolerated the                            procedure well. The quality of the bowel                            preparation was excellent. The terminal ileum,                            ileocecal valve, appendiceal orifice, and rectum                            were photographed. Scope In: 8:07:36 AM Scope Out: 8:18:57 AM Scope Withdrawal Time: 0 hours 9 minutes 24 seconds  Total Procedure Duration: 0 hours 11 minutes 21 seconds  Findings:                 The terminal ileum appeared normal.                           A 3 mm polyp was found in the transverse colon. The                            polyp was sessile. The polyp was removed with a                            cold snare. Resection and retrieval were complete.                           Non-bleeding internal hemorrhoids were found during                            retroflexion. The hemorrhoids were small. Complications:  No immediate complications. Estimated Blood Loss:     Estimated blood loss was minimal. Impression:               - The examined portion of the ileum was normal.                           - One 3 mm polyp in the transverse colon, removed                            with a cold snare. Resected and retrieved.                           - Non-bleeding internal hemorrhoids. Recommendation:           - Discharge patient to home (with escort).                           - Await pathology results.                           - The findings and recommendations were discussed                            with the patient. Dr Particia Lather "[name]" [name],  05/17/2023 8:25:41 AM

## 2023-05-17 NOTE — Progress Notes (Signed)
Pt's states no medical or surgical changes since previsit or office visit. 

## 2023-05-17 NOTE — Progress Notes (Signed)
Called to room to assist during endoscopic procedure.  Patient ID and intended procedure confirmed with present staff. Received instructions for my participation in the procedure from the performing physician.  

## 2023-05-17 NOTE — Progress Notes (Signed)
To pacu, VSS. Report to [email or web address] 

## 2023-05-17 NOTE — Progress Notes (Signed)
GASTROENTEROLOGY PROCEDURE H&P NOTE   Primary Care Physician: Clayborn Heron, MD    Reason for Procedure:   Colon cancer screening  Plan:    Colonoscopy  Patient is appropriate for endoscopic procedure(s) in the ambulatory ([facility]) setting.  The nature of the procedure, as well as the risks, benefits, and alternatives were carefully and thoroughly reviewed with the patient. Ample time for discussion and questions allowed. The patient understood, was satisfied, and agreed to proceed.     HPI: Katrina LAPIERRE is a 47 y.o. female who presents for colonoscopy for colon cancer screening. Denies blood in stools, changes in bowel habits, or unintentional weight loss. Maternal grandmother had colon cancer.   Past Medical History:  Diagnosis Date   Depression    Diabetes mellitus without complication [facility])     Past Surgical History:  Procedure Laterality Date   BUNIONECTOMY Right 2016   CYST REMOVAL LEG Bilateral    Bilateral feet-2006 and 2016   TEAR DUCT PROBING     As a child   TONSILLECTOMY  2001    Prior to Admission medications   Medication Sig Start Date End Date Taking? Authorizing Provider  aspirin 81 MG EC tablet daily.   Yes [provider]  escitalopram (LEXAPRO) 20 MG tablet Take 1 tablet by mouth daily 10/26/21  Yes   escitalopram (LEXAPRO) 20 MG tablet Take 1 tablet (20 mg total) by mouth daily. 01/18/23  Yes   glucose blood (ACCU-CHEK GUIDE) test strip Use as instructed 3 times a day 07/09/22  Yes Carlus Pavlov, MD  Insulin Lispro-aabc (LYUMJEV) 100 UNIT/ML SOLN Inject up to 80 units a day in the insulin pump 05/16/23  Yes Carlus Pavlov, MD  Lancets [facility] ULTRASOFT) lancets 1 each by Other route daily. To check blood sugars. 06/10/20  Yes Carlus Pavlov, MD  levothyroxine (SYNTHROID) 125 MCG tablet Take 1 tablet (125 mcg total) by mouth every morning. 05/16/23  Yes Carlus Pavlov, MD  Multiple Vitamin (MULTIVITAMIN ADULT PO) Take by  mouth.   Yes [provider]  norgestrel-ethinyl estradiol (ELINEST) 0.3-30 MG-MCG tablet Take 1 tablet by mouth once a day 04/15/22  Yes   norgestrel-ethinyl estradiol (ELINEST) 0.3-30 MG-MCG tablet Take 1 tablet by mouth daily. 05/24/22  Yes   norgestrel-ethinyl estradiol (ELINEST) 0.3-30 MG-MCG tablet Take 1 tablet by mouth daily. 04/12/23  Yes   rosuvastatin (CRESTOR) 10 MG tablet Take 1 tablet by mouth daily Patient taking differently: Take 10 mg by mouth daily. Taking due to DM 1 10/26/21  Yes   rosuvastatin (CRESTOR) 10 MG tablet Take 1 tablet (10 mg total) by mouth daily. 01/17/23  Yes   Glucagon 3 MG/DOSE POWD Place 3 mg into the nose once as needed for up to 1 dose. 12/31/21   Carlus Pavlov, MD  GVOKE HYPOPEN 1-PACK 1 MG/0.2ML SOAJ Use as needed for hypoglycemia Patient not taking: Reported on 04/20/2023 02/15/23   Carlus Pavlov, MD  valACYclovir (VALTREX) 500 MG tablet Take 1 tablet (500 mg total) by mouth daily. Patient taking differently: Take 500 mg by mouth as needed. 05/24/22       Current Outpatient Medications  Medication Sig Dispense Refill   aspirin 81 MG EC tablet daily.     escitalopram (LEXAPRO) 20 MG tablet Take 1 tablet by mouth daily 90 tablet 1   escitalopram (LEXAPRO) 20 MG tablet Take 1 tablet (20 mg total) by mouth daily. 90 tablet 1   glucose blood (ACCU-CHEK GUIDE) test strip Use as instructed  3 times a day 200 each 3   Insulin Lispro-aabc (LYUMJEV) 100 UNIT/ML SOLN Inject up to 80 units a day in the insulin pump 70 mL 3   Lancets (ONETOUCH ULTRASOFT) lancets 1 each by Other route daily. To check blood sugars. 600 each 1   levothyroxine (SYNTHROID) 125 MCG tablet Take 1 tablet (125 mcg total) by mouth every morning. 90 tablet 3   Multiple Vitamin (MULTIVITAMIN ADULT PO) Take by mouth.     norgestrel-ethinyl estradiol (ELINEST) 0.3-30 MG-MCG tablet Take 1 tablet by mouth once a day 28 tablet 0   norgestrel-ethinyl estradiol (ELINEST) 0.3-30 MG-MCG tablet  Take 1 tablet by mouth daily. 84 tablet 3   norgestrel-ethinyl estradiol (ELINEST) 0.3-30 MG-MCG tablet Take 1 tablet by mouth daily. 84 tablet 0   rosuvastatin (CRESTOR) 10 MG tablet Take 1 tablet by mouth daily (Patient taking differently: Take 10 mg by mouth daily. Taking due to DM 1) 90 tablet 1   rosuvastatin (CRESTOR) 10 MG tablet Take 1 tablet (10 mg total) by mouth daily. 90 tablet 1   Glucagon 3 MG/DOSE POWD Place 3 mg into the nose once as needed for up to 1 dose. 1 each 11   GVOKE HYPOPEN 1-PACK 1 MG/0.2ML SOAJ Use as needed for hypoglycemia (Patient not taking: Reported on 04/20/2023) 0.2 mL PRN   valACYclovir (VALTREX) 500 MG tablet Take 1 tablet (500 mg total) by mouth daily. (Patient taking differently: Take 500 mg by mouth as needed.) 90 tablet 1   Current Facility-Administered Medications  Medication Dose Route Frequency Provider Last Rate Last Admin   0.9 %  sodium chloride infusion  500 mL Intravenous Once Imogene Burn, MD        Allergies as of 05/17/2023 - Review Complete 05/17/2023  Allergen Reaction Noted   Percocet [oxycodone-acetaminophen]  04/20/2023    Family History  Problem Relation Age of Onset   Colon polyps Mother    Metabolic syndrome Mother    Thyroid disease Mother    Colon polyps Father    Hyperlipidemia Father    Hypertension Father    Hypertension Sister    Colon cancer Maternal Grandmother    Esophageal cancer Neg Hx    Rectal cancer Neg Hx    Stomach cancer Neg Hx     Social History   Socioeconomic History   Marital status: Married    Spouse name: Not on file   Number of children: 0   Years of education: Not on file   Highest education level: Not on file  Occupational History   Occupation: Charity fundraiser at [employer]  Tobacco Use   Smoking status: Never   Smokeless tobacco: Never  Vaping Use   Vaping status: Never Used  Substance and Sexual Activity   Alcohol use: Not Currently   Drug use: Never   Sexual activity: Not on file  Other  Topics Concern   Not on file  Social History Narrative   Not on file   Social Determinants of Health   Financial Resource Strain: Not on file  Food Insecurity: Not on file  Transportation Needs: Not on file  Physical Activity: Not on file  Stress: Not on file  Social Connections: Not on file  Intimate Partner Violence: Not At Risk (03/25/2022)   [facility]    Threatened: Not on file    Insulted: Not on file    Physically Hurt : Not on file    Scream: Not on  file    Physical Exam: Vital signs in last 24 hours: BP (!) 107/54   Pulse 73   Temp 98.1 F (36.7 C) (Temporal)   Ht 5\' 4"  (1.626 m)   Wt 170 lb (77.1 kg)   LMP 05/10/2023 (Approximate)   SpO2 98%   BMI 29.18 kg/m  GEN: NAD EYE: Sclerae anicteric ENT: MMM CV: Non-tachycardic Pulm: No increased work of breathing GI: Soft, NT/ND NEURO:  Alert & Oriented   Eulah Pont, MD [facility]  05/17/2023 8:04 AM

## 2023-05-17 NOTE — Patient Instructions (Addendum)
Resume previous diet Continue present medications Await pathology results Handouts/information given for polyps and hemorrhoids  YOU HAD AN ENDOSCOPIC PROCEDURE TODAY AT THE [facility]:   Refer to the procedure report that was given to you for any specific questions about what was found during the examination.  If the procedure report does not answer your questions, please call your gastroenterologist to clarify.  If you requested that your care partner not be given the details of your procedure findings, then the procedure report has been included in a sealed envelope for you to review at your convenience later.  YOU SHOULD EXPECT: Some feelings of bloating in the abdomen. Passage of more gas than usual.  Walking can help get rid of the air that was put into your GI tract during the procedure and reduce the bloating. If you had a lower endoscopy (such as a colonoscopy or flexible sigmoidoscopy) you may notice spotting of blood in your stool or on the toilet paper. If you underwent a bowel prep for your procedure, you may not have a normal bowel movement for a few days.  Please Note:  You might notice some irritation and congestion in your nose or some drainage.  This is from the oxygen used during your procedure.  There is no need for concern and it should clear up in a day or so.  SYMPTOMS TO REPORT IMMEDIATELY:  Following lower endoscopy (colonoscopy or flexible sigmoidoscopy):  Excessive amounts of blood in the stool  Significant tenderness or worsening of abdominal pains  Swelling of the abdomen that is new, acute  Fever of 100F or higher  For urgent or emergent issues, a gastroenterologist can be reached at any hour by calling [phone]. Do not use MyChart messaging for urgent concerns.    DIET:  We do recommend a small meal at first, but then you may proceed to your regular diet.  Drink plenty of fluids but you should avoid alcoholic beverages for 24  hours.  ACTIVITY:  You should plan to take it easy for the rest of today and you should NOT DRIVE or use heavy machinery until tomorrow (because of the sedation medicines used during the test).    FOLLOW UP: Our staff will call the number listed on your records the next business day following your procedure.  We will call around 7:15- 8:00 am to check on you and address any questions or concerns that you may have regarding the information given to you following your procedure. If we do not reach you, we will leave a message.     If any biopsies were taken you will be contacted by phone or by letter within the next 1-3 weeks.  Please call us at [phone] if you have not heard about the biopsies in 3 weeks.    SIGNATURES/CONFIDENTIALITY: You and/or your care partner have signed paperwork which will be entered into your electronic medical record.  These signatures attest to the fact that that the information above on your After Visit Summary has been reviewed and is understood.  Full responsibility of the confidentiality of this discharge information lies with you and/or your care-partner.

## 2023-05-18 ENCOUNTER — Telehealth: Payer: Self-pay

## 2023-05-18 NOTE — Telephone Encounter (Signed)
  Follow up Call-     05/17/2023    7:26 AM  Call back number  Post procedure Call Back phone  # [phone]  Permission to leave phone message Yes     Patient questions:  Do you have a fever, pain , or abdominal swelling? No. Pain Score  0 *  Have you tolerated food without any problems? Yes.    Have you been able to return to your normal activities? Yes.    Do you have any questions about your discharge instructions: Diet   No. Medications  No. Follow up visit  No.  Do you have questions or concerns about your Care? No.  Actions: * If pain score is 4 or above: No action needed, pain <4.

## 2023-05-20 ENCOUNTER — Encounter: Payer: Self-pay | Admitting: Internal Medicine

## 2023-05-20 LAB — SURGICAL PATHOLOGY

## 2023-05-25 DIAGNOSIS — M9903 Segmental and somatic dysfunction of lumbar region: Secondary | ICD-10-CM | POA: Diagnosis not present

## 2023-05-25 DIAGNOSIS — M5432 Sciatica, left side: Secondary | ICD-10-CM | POA: Diagnosis not present

## 2023-05-26 DIAGNOSIS — E109 Type 1 diabetes mellitus without complications: Secondary | ICD-10-CM | POA: Diagnosis not present

## 2023-05-26 DIAGNOSIS — E1065 Type 1 diabetes mellitus with hyperglycemia: Secondary | ICD-10-CM | POA: Diagnosis not present

## 2023-05-27 ENCOUNTER — Other Ambulatory Visit (HOSPITAL_COMMUNITY): Payer: Self-pay

## 2023-06-01 ENCOUNTER — Other Ambulatory Visit: Payer: Self-pay

## 2023-06-01 ENCOUNTER — Other Ambulatory Visit (HOSPITAL_COMMUNITY): Payer: Self-pay

## 2023-06-01 DIAGNOSIS — Z6829 Body mass index (BMI) 29.0-29.9, adult: Secondary | ICD-10-CM | POA: Diagnosis not present

## 2023-06-01 DIAGNOSIS — E1065 Type 1 diabetes mellitus with hyperglycemia: Secondary | ICD-10-CM | POA: Diagnosis not present

## 2023-06-01 DIAGNOSIS — Z3041 Encounter for surveillance of contraceptive pills: Secondary | ICD-10-CM | POA: Diagnosis not present

## 2023-06-01 DIAGNOSIS — E7800 Pure hypercholesterolemia, unspecified: Secondary | ICD-10-CM | POA: Diagnosis not present

## 2023-06-01 DIAGNOSIS — Z9641 Presence of insulin pump (external) (internal): Secondary | ICD-10-CM | POA: Diagnosis not present

## 2023-06-01 DIAGNOSIS — Z0000 Encounter for general adult medical examination without abnormal findings: Secondary | ICD-10-CM | POA: Diagnosis not present

## 2023-06-01 DIAGNOSIS — F324 Major depressive disorder, single episode, in partial remission: Secondary | ICD-10-CM | POA: Diagnosis not present

## 2023-06-01 MED ORDER — ESCITALOPRAM OXALATE 20 MG PO TABS
20.0000 mg | ORAL_TABLET | Freq: Every day | ORAL | 1 refills | Status: DC
  Filled 2023-06-01 – 2023-07-16 (×2): qty 90, 90d supply, fill #0
  Filled 2023-10-11: qty 90, 90d supply, fill #1

## 2023-06-01 MED ORDER — ELINEST 0.3-30 MG-MCG PO TABS
1.0000 | ORAL_TABLET | Freq: Every day | ORAL | 3 refills | Status: AC
  Filled 2023-06-01 – 2023-06-30 (×2): qty 84, 84d supply, fill #0
  Filled 2023-09-22: qty 84, 84d supply, fill #1
  Filled 2023-12-15: qty 84, 84d supply, fill #2

## 2023-06-01 MED ORDER — ROSUVASTATIN CALCIUM 10 MG PO TABS
10.0000 mg | ORAL_TABLET | Freq: Every day | ORAL | 1 refills | Status: DC
  Filled 2023-06-01 – 2023-07-16 (×2): qty 90, 90d supply, fill #0
  Filled 2023-10-11: qty 90, 90d supply, fill #1

## 2023-06-13 ENCOUNTER — Encounter: Payer: Self-pay | Admitting: Internal Medicine

## 2023-06-13 ENCOUNTER — Ambulatory Visit: Payer: Commercial Managed Care - PPO | Admitting: Internal Medicine

## 2023-06-13 VITALS — BP 112/60 | HR 78 | Ht 64.0 in | Wt 171.6 lb

## 2023-06-13 DIAGNOSIS — E1065 Type 1 diabetes mellitus with hyperglycemia: Secondary | ICD-10-CM

## 2023-06-13 DIAGNOSIS — E063 Autoimmune thyroiditis: Secondary | ICD-10-CM | POA: Diagnosis not present

## 2023-06-13 LAB — TSH: TSH: 1.76 u[IU]/mL (ref 0.35–5.50)

## 2023-06-13 LAB — T4, FREE: Free T4: 0.83 ng/dL (ref 0.60–1.60)

## 2023-06-13 LAB — HEMOGLOBIN A1C: Hemoglobin A1C: 7.3

## 2023-06-13 NOTE — Patient Instructions (Addendum)
Please use the following pump settings: - basal rates: 12 am: 0.875 units/h 2 am: 0.825 >> 0.850 7 am: 0.800 >> 0.850 8:30 am: 0.825 >> 0.850 1 pm: 0.875 4 pm: 1.050 - ICR:  12 am-5 pm: 1:11 5 pm-12 am: 1:11  - target: 110-110 - ISF: 45 - Insulin on Board: 3h  Try not to suspend the pump.    Please continue Levothyroxine 125 mcg daily.  Take the thyroid hormone every day, with water, at least 30 minutes before breakfast, separated by at least 4 hours from: - acid reflux medications - calcium - iron - multivitamins  Please return in 4 months.

## 2023-06-13 NOTE — Progress Notes (Signed)
Patient ID: Katrina Brooks, female   DOB: [birth year], 47 y.o.   MRN: [id number]   HPI: Katrina Brooks is a 47 y.o.-year-old female, initially referred by her PCP, Dr. Hermelinda Medicus, returning for follow-up for DM1, diagnosed as a child (47 y/o), uncontrolled, without complications and Hashimoto's hypothyroidism.  She moved from [place] 2021.  Last visit 4 months ago.  Interim history: No increased urination, blurry vision, nausea, chest pain. She previously lost 20 pounds (last year) using the "Lose it" app and counting calories.  Weight loss slowed down afterwards.  Before last visit, she gained several pounds.  She restarted going to the gym >> hurt back >> stopped. She had steroids, saw a Land. She returned to the gym last week - does cardio (running, stairmaster) ans some weights. Her mother was very sick in 04/2023 >> she was with her in the hospital for many days.  She recovered well.  Reviewed HbA1c levels: Lab Results  Component Value Date   HGBA1C 7.3 02/09/2023   HGBA1C 7.0 (A) 09/10/2022   HGBA1C 7.7 (A) 05/13/2022   HGBA1C 7.8 (A) 12/31/2021   HGBA1C 7.3 (A) 09/01/2021   HGBA1C 7.7 (A) 04/30/2021   HGBA1C 7.5 (A) 12/23/2020   HGBA1C 7.7 (A) 09/10/2020   HGBA1C 8.1 (A) 06/02/2020    Previously:   Insulin pump:  -since 47 y/o -only had Medtronic pumps -Medtronic 670 G - since 2018 - with clear ring >> got a replacement -Medtronic 770G - since 01-09/2021 -Medtronic 780G - since 04/2022  CGM: -Medtronic Guardian  Insulin: -Previously on NovoLog - [place] approved -tried Apidra >> did not like it -tried Humalog >> slower onset, was not controlling her sugars very well -Now Lyumjev  Supplies: -prev. Medtronic  -[facility] [employer] now  Pump settings: - basal rates: 12 am: 0.875 units/h 2 am: 0.875 >> 0.825 7 am: 0.800 8:30 am: 0.825 1 pm: 0.875 4 pm: 1.050 - ICR:  12 am-5 pm: 1:12 >> 1:11 (try 1:10 if sugars still high after meals) 5 pm-12 am: 1:11  - target:  110-110 - ISF: 45 - Insulin on Board: 3h - bolus wizard: on - extended bolusing: not using - changes infusion site: q 5.5 >> q3-4 days TDD from basal insulin: 45% >> 44% >> 39% TDD from bolus insulin: 55% >> 56% >> 61% Total daily dose: 36-70 units daily Meter: Contour Next  She checks her sugars more than 4 times a day with her CGM:  Previously:  Previously:  Lowest sugar was <40 (yardwork) ... >> 60s >> 40s >> (gym): 50s; she has hypoglycemia awareness in the 60s.  She has a glucagon kit at home.  No previous hypoglycemia admission. Highest sugar was 500 (steroids for Plantar fasciitis) >> .Marland Kitchen. 306 >> 300s >> 260.  No previous DKA admissions.  Pt's meals are: - Breakfast: Coffee, [place] sausage sticks, cottage cheese, fruit; skips when not working >> mostly coffee, skips - Lunch: [employer] or sandwich, fruit - Dinner: Meat and vegetables or seafood - Snacks: several, various She works 4 days a week, 8 AM to 5:30 PM (at [employer])  -No CKD but history of MAU, last BUN/creatinine:  06/01/2023: 9/0.7, GFR 108, glucose 123 Lab Results  Component Value Date   BUN 9 09/01/2021   BUN 9 09/10/2020   CREATININE 0.72 09/01/2021   CREATININE 0.72 09/10/2020   Lab Results  Component Value Date   MICRALBCREAT 1.3 02/09/2023   MICRALBCREAT 1.1 09/01/2021   MICRALBCREAT 0.5 09/10/2020   -+  HL; last set of lipids: 06/01/2023: 133/57/58/63 05/13/2022: 130/52/58/60 Lab Results  Component Value Date   CHOL 129 09/01/2021   HDL 58.50 09/01/2021   LDLCALC 56 09/01/2021   TRIG 72.0 09/01/2021   CHOLHDL 2 09/01/2021  11/02/2019: 124/62/66/54 On Crestor 10 per cardiology. CAC was 0 in 07/2019. On ASA 81.  - last eye exam: 04/12/2023: No DR.  - No numbness and tingling in her feet.  Last foot exam 02/09/2023.  Pt has FH of DM2 in grandfather, great aunt. Metabolic syndrome in mother.  Hypothyroidism: -Due to Hashimoto's thyroiditis per review of records from previous  endocrinologist  Pt is on levothyroxine 125 mcg daily, taken: - in am - fasting - at least 30 min from b'fast - no calcium - no iron - + multivitamins at night - no PPIs - not on Biotin  Reviewed TSH levels: Lab Results  Component Value Date   TSH 1.56 09/10/2022   TSH 1.06 09/01/2021   TSH 1.44 10/29/2020   TSH 5.12 (H) 09/10/2020  11/02/2019: TSH 1.07 (0.4-4.5), free T4 1.2 (0.8-1.8)  She is on OCPs for irregular menses.  In [place], she was working in the [employer].  ROS: + See HPI  I reviewed pt's medications, allergies, PMH, social hx, family hx, and changes were documented in the history of present illness. Otherwise, unchanged from my initial visit note.  Past Surgical History:  Procedure Laterality Date   BUNIONECTOMY Right 2016   CYST REMOVAL LEG Bilateral    Bilateral feet-2006 and 2016   TEAR DUCT PROBING     As a child   TONSILLECTOMY  2001   Social History   Socioeconomic History   Marital status: Married    Spouse name: Not on file   Number of children: 0   Years of education: Not on file   Highest education level: Not on file  Occupational History   Occupation: Charity fundraiser at [employer]  Tobacco Use   Smoking status: Never   Smokeless tobacco: Never  Vaping Use   Vaping status: Never Used  Substance and Sexual Activity   Alcohol use: Not Currently   Drug use: Never   Sexual activity: Not on file  Other Topics Concern   Not on file  Social History Narrative   Not on file   Social Determinants of Health   Financial Resource Strain: Not on file  Food Insecurity: Not on file  Transportation Needs: Not on file  Physical Activity: Not on file  Stress: Not on file  Social Connections: Not on file  Intimate Partner Violence: Not At Risk (03/25/2022)   [facility]    Threatened: Not on file    Insulted: Not on file    Physically Hurt : Not on file    Scream: Not on file   Current Outpatient  Medications on File Prior to Visit  Medication Sig Dispense Refill   aspirin 81 MG EC tablet daily.     escitalopram (LEXAPRO) 20 MG tablet Take 1 tablet by mouth daily 90 tablet 1   escitalopram (LEXAPRO) 20 MG tablet Take 1 tablet (20 mg total) by mouth daily. 90 tablet 1   Glucagon 3 MG/DOSE POWD Place 3 mg into the nose once as needed for up to 1 dose. 1 each 11   glucose blood (ACCU-CHEK GUIDE) test strip Use as instructed 3 times a day 200 each 3   GVOKE HYPOPEN 1-PACK 1 MG/0.2ML SOAJ Use as needed for  hypoglycemia (Patient not taking: Reported on 04/20/2023) 0.2 mL PRN   Insulin Lispro-aabc (LYUMJEV) 100 UNIT/ML SOLN Inject up to 80 units a day in the insulin pump 70 mL 3   Lancets (ONETOUCH ULTRASOFT) lancets 1 each by Other route daily. To check blood sugars. 600 each 1   levothyroxine (SYNTHROID) 125 MCG tablet Take 1 tablet (125 mcg total) by mouth every morning. 90 tablet 3   Multiple Vitamin (MULTIVITAMIN ADULT PO) Take by mouth.     norgestrel-ethinyl estradiol (ELINEST) 0.3-30 MG-MCG tablet Take 1 tablet by mouth once a day 28 tablet 0   norgestrel-ethinyl estradiol (ELINEST) 0.3-30 MG-MCG tablet Take 1 tablet by mouth daily. 84 tablet 3   norgestrel-ethinyl estradiol (ELINEST) 0.3-30 MG-MCG tablet Take 1 tablet by mouth daily. 84 tablet 3   rosuvastatin (CRESTOR) 10 MG tablet Take 1 tablet by mouth daily (Patient taking differently: Take 10 mg by mouth daily. Taking due to DM 1) 90 tablet 1   rosuvastatin (CRESTOR) 10 MG tablet Take 1 tablet (10 mg total) by mouth daily. 90 tablet 1   valACYclovir (VALTREX) 500 MG tablet Take 1 tablet (500 mg total) by mouth daily. (Patient taking differently: Take 500 mg by mouth as needed.) 90 tablet 1   No current facility-administered medications on file prior to visit.   Allergies  Allergen Reactions   Percocet [Oxycodone-Acetaminophen]     Low BP      Family History  Problem Relation Age of Onset   Colon polyps Mother    Metabolic  syndrome Mother    Thyroid disease Mother    Colon polyps Father    Hyperlipidemia Father    Hypertension Father    Hypertension Sister    Colon cancer Maternal Grandmother    Esophageal cancer Neg Hx    Rectal cancer Neg Hx    Stomach cancer Neg Hx     + Grandfather and great aunt with type 2 diabetes + Grandmother with heart problems + Grandfather with Addison's disease  PE: BP 112/60   Pulse 78   Ht 5\' 4"  (1.626 m)   Wt 171 lb 9.6 oz (77.8 kg)   LMP 05/10/2023 (Approximate)   SpO2 97%   BMI 29.46 kg/m  Wt Readings from Last 10 Encounters:  06/13/23 171 lb 9.6 oz (77.8 kg)  05/17/23 170 lb (77.1 kg)  04/20/23 170 lb (77.1 kg)  02/09/23 175 lb (79.4 kg)  09/10/22 175 lb (79.4 kg)  05/13/22 168 lb 12.8 oz (76.6 kg)  12/31/21 165 lb 12.8 oz (75.2 kg)  09/01/21 173 lb 6.4 oz (78.7 kg)  04/30/21 170 lb (77.1 kg)  12/23/20 186 lb 6.4 oz (84.6 kg)   Constitutional: Slightly overweight, in NAD Eyes:  EOMI, no exophthalmos ENT: no neck masses, no cervical lymphadenopathy Cardiovascular: RRR, No MRG Respiratory: CTA B Musculoskeletal: no deformities Skin:no rashes Neurological: no tremor with outstretched hands  ASSESSMENT: 1. DM1, uncontrolled, without long-term complications, but with hyperglycemia  2.  Hashimoto's Hypothyroidism  PLAN:  1. Patient with longstanding type 1 diabetes, on insulin pump therapy.  She has always been on the Medtronic insulin pump for the last 25 years, currently on the 780 G model.  She is decided to continue with the Medtronic pump.  Reviewing the CGM trends at last visit, sugars appears to be slightly higher than before, with a trend of higher blood sugars overnight corresponding to her craving food and eating at night.  Sugars were decreasing after approximately 4 AM, with a  nadir around 7 AM and then increasing blood sugars after meals.  She was planning to stop nocturnal eating, but since the sugars were trending down overnight, without  bolusing and meal of the night, I recommended to reduce her basal rate after 2 AM.  We also discussed about strengthening her insulin to carb ratios to improve postprandial blood sugars.  We discussed about potentially using extended boluses more. - We previously discussed about changes in blood sugars expected with exercise.  She did notice a decrease in blood sugars after exercise.  She was stopping the pump for the duration of exercise and we discussed about possible strategies to help with hypoglycemia after exercise including doing the temporary basal rate of approximately 50% during exercise and possibly 30 minutes-an hour after; also, stopping the pump if absolutely needed, but doing so at least 30 minutes before exercise.  We discussed about the importance of strength exercises and how they influence blood sugars. -HbA1c at last visit was slightly higher, at 7.3%. CGM interpretation: -At today's visit, we reviewed her CGM downloads: It appears that 75% of values are in target range (goal >70%), while 24% are higher than 180 (goal <25%), and 1% are lower than 70 (goal <4%).  The calculated average blood sugar is 154.  The projected HbA1c for the next 3 months (GMI) is 7.0%. -Reviewing the CGM trends, sugars appear to be stable compared to the prior visits, mostly fluctuating within the target range, with only occasional higher blood sugars especially after meals but also some variability at night.  Upon questioning, she mentions that she sees higher blood sugars overnight, up to the 280s if she is not in the auto mode, even without eating.  We discussed about increasing her basal rate overnight.  Otherwise, we also discussed about trying to bolus before each meal and, since the sugars are usually increasing abruptly after she suspends the pump, I advised her to try to suspend less frequently.  She sometimes does this during exercise.  We discussed about how to avoid the need to suspend it.  I recommended  to start either with cardio or strength exercises, depending on the blood sugars before the exercise session started.  I would not suggest that the changes for now. -We did discuss about other pumps on the market and especially about the t:slim and we will be insulin pumps by [employer].  I feel that she would be a good candidate for these. - I suggested to: Patient Instructions  Please use the following pump settings: - basal rates: 12 am: 0.875 units/h 2 am: 0.825 >> 0.850 7 am: 0.800 >> 0.850 8:30 am: 0.825 >> 0.850 1 pm: 0.875 4 pm: 1.050 - ICR:  12 am-5 pm: 1:11 5 pm-12 am: 1:11  - target: 110-110 - ISF: 45 - Insulin on Board: 3h  Try not to suspend the pump.    Please continue Levothyroxine 125 mcg daily.  Take the thyroid hormone every day, with water, at least 30 minutes before breakfast, separated by at least 4 hours from: - acid reflux medications - calcium - iron - multivitamins  Please return in 4 months.   - we checked her HbA1c: 7.3% (stable) - advised to check sugars at different times of the day - 4x a day, rotating check times - advised for yearly eye exams >> she is UTD - return to clinic in 4 months  2.  Hypothyroidism - latest thyroid labs reviewed with pt. >> normal: Lab Results  Component  Value Date   TSH 1.56 09/10/2022  - she continues on LT4 125 mcg daily - pt feels good on this dose. - we discussed about taking the thyroid hormone every day, with water, >30 minutes before breakfast, separated by >4 hours from acid reflux medications, calcium, iron, multivitamins. Pt. is taking it correctly. - will check thyroid tests now  Component     Latest Ref Rng 06/13/2023  TSH     0.35 - 5.50 uIU/mL 1.76   T4,Free(Direct)     0.60 - 1.60 ng/dL 8.65   Normal TFTs.  Carlus Pavlov, MD PhD [facility]

## 2023-06-14 ENCOUNTER — Ambulatory Visit: Payer: Commercial Managed Care - PPO | Admitting: Internal Medicine

## 2023-06-30 ENCOUNTER — Other Ambulatory Visit: Payer: Self-pay | Admitting: Internal Medicine

## 2023-06-30 ENCOUNTER — Encounter (HOSPITAL_COMMUNITY): Payer: Self-pay

## 2023-06-30 ENCOUNTER — Other Ambulatory Visit: Payer: Self-pay

## 2023-06-30 ENCOUNTER — Other Ambulatory Visit (HOSPITAL_COMMUNITY): Payer: Self-pay

## 2023-06-30 DIAGNOSIS — E1065 Type 1 diabetes mellitus with hyperglycemia: Secondary | ICD-10-CM

## 2023-06-30 MED ORDER — GLUCOSE BLOOD VI STRP
ORAL_STRIP | 3 refills | Status: DC
  Filled 2023-06-30: qty 200, 67d supply, fill #0
  Filled 2023-07-18: qty 200, 66d supply, fill #0

## 2023-07-05 ENCOUNTER — Other Ambulatory Visit (HOSPITAL_BASED_OUTPATIENT_CLINIC_OR_DEPARTMENT_OTHER): Payer: Self-pay

## 2023-07-08 ENCOUNTER — Other Ambulatory Visit (HOSPITAL_COMMUNITY): Payer: Self-pay

## 2023-07-14 DIAGNOSIS — M5432 Sciatica, left side: Secondary | ICD-10-CM | POA: Diagnosis not present

## 2023-07-14 DIAGNOSIS — M9903 Segmental and somatic dysfunction of lumbar region: Secondary | ICD-10-CM | POA: Diagnosis not present

## 2023-07-16 ENCOUNTER — Other Ambulatory Visit (HOSPITAL_BASED_OUTPATIENT_CLINIC_OR_DEPARTMENT_OTHER): Payer: Self-pay

## 2023-07-16 ENCOUNTER — Other Ambulatory Visit (HOSPITAL_COMMUNITY): Payer: Self-pay

## 2023-07-18 ENCOUNTER — Other Ambulatory Visit: Payer: Self-pay

## 2023-07-18 ENCOUNTER — Other Ambulatory Visit (HOSPITAL_COMMUNITY): Payer: Self-pay

## 2023-07-19 ENCOUNTER — Other Ambulatory Visit: Payer: Self-pay

## 2023-07-20 ENCOUNTER — Other Ambulatory Visit: Payer: Self-pay

## 2023-07-20 ENCOUNTER — Other Ambulatory Visit (HOSPITAL_COMMUNITY): Payer: Self-pay

## 2023-07-20 ENCOUNTER — Other Ambulatory Visit: Payer: Self-pay | Admitting: Internal Medicine

## 2023-07-20 ENCOUNTER — Encounter: Payer: Self-pay | Admitting: Internal Medicine

## 2023-07-20 DIAGNOSIS — E1065 Type 1 diabetes mellitus with hyperglycemia: Secondary | ICD-10-CM

## 2023-07-20 MED ORDER — GLUCOSE BLOOD VI STRP
ORAL_STRIP | 3 refills | Status: DC
  Filled 2023-07-20: qty 200, fill #0

## 2023-07-20 MED ORDER — FREESTYLE LITE W/DEVICE KIT
PACK | 0 refills | Status: DC
  Filled 2023-07-20: qty 1, 30d supply, fill #0

## 2023-07-20 MED ORDER — FREESTYLE LANCETS MISC
12 refills | Status: DC
  Filled 2023-07-20: qty 200, 67d supply, fill #0

## 2023-07-20 MED ORDER — FREESTYLE LITE TEST VI STRP
ORAL_STRIP | 12 refills | Status: DC
  Filled 2023-07-20 (×2): qty 250, 83d supply, fill #0

## 2023-07-21 ENCOUNTER — Other Ambulatory Visit (HOSPITAL_COMMUNITY): Payer: Self-pay

## 2023-08-10 ENCOUNTER — Other Ambulatory Visit (HOSPITAL_COMMUNITY): Payer: Self-pay

## 2023-08-12 ENCOUNTER — Other Ambulatory Visit: Payer: Self-pay

## 2023-08-15 ENCOUNTER — Other Ambulatory Visit: Payer: Self-pay

## 2023-08-18 DIAGNOSIS — M5432 Sciatica, left side: Secondary | ICD-10-CM | POA: Diagnosis not present

## 2023-08-18 DIAGNOSIS — M9903 Segmental and somatic dysfunction of lumbar region: Secondary | ICD-10-CM | POA: Diagnosis not present

## 2023-08-19 ENCOUNTER — Other Ambulatory Visit (HOSPITAL_COMMUNITY): Payer: Self-pay

## 2023-08-23 DIAGNOSIS — E109 Type 1 diabetes mellitus without complications: Secondary | ICD-10-CM | POA: Diagnosis not present

## 2023-08-23 DIAGNOSIS — E1065 Type 1 diabetes mellitus with hyperglycemia: Secondary | ICD-10-CM | POA: Diagnosis not present

## 2023-09-05 ENCOUNTER — Other Ambulatory Visit (HOSPITAL_COMMUNITY): Payer: Self-pay

## 2023-09-05 DIAGNOSIS — E1065 Type 1 diabetes mellitus with hyperglycemia: Secondary | ICD-10-CM | POA: Diagnosis not present

## 2023-09-05 DIAGNOSIS — E109 Type 1 diabetes mellitus without complications: Secondary | ICD-10-CM | POA: Diagnosis not present

## 2023-09-06 DIAGNOSIS — M9903 Segmental and somatic dysfunction of lumbar region: Secondary | ICD-10-CM | POA: Diagnosis not present

## 2023-09-06 DIAGNOSIS — M5432 Sciatica, left side: Secondary | ICD-10-CM | POA: Diagnosis not present

## 2023-09-22 ENCOUNTER — Other Ambulatory Visit (HOSPITAL_COMMUNITY): Payer: Self-pay

## 2023-10-03 DIAGNOSIS — M5432 Sciatica, left side: Secondary | ICD-10-CM | POA: Diagnosis not present

## 2023-10-03 DIAGNOSIS — M9903 Segmental and somatic dysfunction of lumbar region: Secondary | ICD-10-CM | POA: Diagnosis not present

## 2023-10-11 ENCOUNTER — Other Ambulatory Visit (HOSPITAL_COMMUNITY): Payer: Self-pay

## 2023-10-12 ENCOUNTER — Encounter: Payer: Self-pay | Admitting: Internal Medicine

## 2023-10-12 ENCOUNTER — Ambulatory Visit: Payer: Commercial Managed Care - PPO | Admitting: Internal Medicine

## 2023-10-12 VITALS — BP 120/70 | HR 74 | Ht 64.0 in | Wt 168.2 lb

## 2023-10-12 DIAGNOSIS — E1065 Type 1 diabetes mellitus with hyperglycemia: Secondary | ICD-10-CM

## 2023-10-12 DIAGNOSIS — E063 Autoimmune thyroiditis: Secondary | ICD-10-CM | POA: Diagnosis not present

## 2023-10-12 LAB — POCT GLYCOSYLATED HEMOGLOBIN (HGB A1C): Hemoglobin A1C: 7.3 % — AB (ref 4.0–5.6)

## 2023-10-12 NOTE — Patient Instructions (Addendum)
 Please use the following pump settings: - basal rates: 12 am: 0.875 >> 0.9 units/h 2 am: 0.850 >> 0.9 1 pm: 0.875 >> 0.9 4 pm: 1.050 >> 1.1 - ICR:  12 am-5 pm: 1:11 5 pm-12 am: 1:11  - target: 110-110 - ISF: 45 - Insulin on Board: 3h   Try not to eat at night.   Please continue Levothyroxine 125 mcg daily.  Take the thyroid hormone every day, with water, at least 30 minutes before breakfast, separated by at least 4 hours from: - acid reflux medications - calcium - iron - multivitamins  Please return in 4 months.

## 2023-10-12 NOTE — Progress Notes (Signed)
 Patient ID: Katrina Brooks, female   DOB: 26-Nov-1975, 48 y.o.   MRN: [id number]   HPI: Katrina Brooks is a 48 y.o.-year-old pleasant female, initially referred by her PCP, Dr. Hermelinda Medicus, returning for follow-up for DM1, diagnosed as a child (48 y/o), uncontrolled, without complications and Hashimoto's hypothyroidism.  She moved from [place] 2021.  Last visit 4 months ago.  Interim history: No increased urination, blurry vision, nausea, chest pain. She previously lost 20 pounds (in 2023) using the "Lose it" app and counting calories.  Weight loss slowed down afterwards and before last visit, she gained 11 pounds.  She restarted going to the gym >> hurt back >> stopped. She had steroids, saw a Land.  Before last visit, she returned to the gym- cardio (running, stairmaster) and some weights. Now not exercising consistently.  She lost 3 pounds since last visit.  Reviewed HbA1c levels: 06/13/2023: HbA1c 7.3% Lab Results  Component Value Date   HGBA1C 7.3 02/09/2023   HGBA1C 7.0 (A) 09/10/2022   HGBA1C 7.7 (A) 05/13/2022   HGBA1C 7.8 (A) 12/31/2021   HGBA1C 7.3 (A) 09/01/2021   HGBA1C 7.7 (A) 04/30/2021   HGBA1C 7.5 (A) 12/23/2020   HGBA1C 7.7 (A) 09/10/2020   HGBA1C 8.1 (A) 06/02/2020    Previously:   Insulin pump:  -since 48 y/o -only had Medtronic pumps -Medtronic 670 G - since 2018 - with clear ring >> got a replacement -Medtronic 770G - since 01-09/2021 -Medtronic 780G - since 04/2022  CGM: -Medtronic Guardian  Insulin: -Previously on NovoLog - [place] approved -tried Apidra >> did not like it -tried Humalog >> slower onset, was not controlling her sugars very well -Now Lyumjev  Supplies: -prev. Medtronic  -[facility] [employer] [facility] now  Pump settings: - basal rates: 12 am: 0.875 units/h 2 am: 0.825 >> 0.850 7 am: 0.800 >> 0.850 8:30 am: 0.825 >> 0.850 1 pm: 0.875 4 pm: 1.050 - ICR:  12 am-5 pm: 1:11 5 pm-12 am: 1:11  - target: 110-110 - ISF: 45 - Insulin on Board: 3h - bolus  wizard: on - extended bolusing: not using - changes infusion site: q 5.5 >> q3-4 days TDD from basal insulin: 45% >> 44% >> 39% >> same TDD from bolus insulin: 55% >> 56% >> 61% >> same  Total daily dose: 36-70 >> 44-70 units daily Meter: Contour Next  She checks her sugars more than 4 times a day with her CGM:  Previously:  Previously:   Lowest sugar was <40 (yardwork) ... >> 40s >> (gym): 50s >> 50s; she has hypoglycemia awareness in the 60s.  She has a glucagon kit at home.  No previous hypoglycemia admission. Highest sugar was 500 (steroids for Plantar fasciitis) >> .Marland KitchenMarland Kitchen 300s >> 260 >> 300s (infusion site pb).  No previous DKA admissions.  Pt's meals are: - Breakfast: Coffee, [place] sausage sticks, cottage cheese, fruit; skips when not working >> mostly coffee, skips - Lunch: [employer] or sandwich, fruit - Dinner: Meat and vegetables or seafood - Snacks: several, various She works 4 days a week, 8 AM to 5:30 PM (at [employer])  -No CKD but history of MAU, last BUN/creatinine:  06/01/2023: 9/0.7, GFR 108, glucose 123 Lab Results  Component Value Date   BUN 9 09/01/2021   BUN 9 09/10/2020   CREATININE 0.72 09/01/2021   CREATININE 0.72 09/10/2020   Lab Results  Component Value Date   MICRALBCREAT 1.3 02/09/2023   MICRALBCREAT 1.1 09/01/2021   MICRALBCREAT 0.5 09/10/2020   -+  HL; last set of lipids: 06/01/2023: 133/57/58/63 Lab Results  Component Value Date   CHOL 129 09/01/2021   HDL 58.50 09/01/2021   LDLCALC 56 09/01/2021   TRIG 72.0 09/01/2021   CHOLHDL 2 09/01/2021  On Crestor 10 per cardiology. CAC was 0 in 07/2019. On ASA 81.  - last eye exam: 04/12/2023: No DR.  - No numbness and tingling in her feet.  Last foot exam 02/09/2023.  Pt has FH of DM2 in grandfather, great aunt. Metabolic syndrome in mother.  Hypothyroidism: -Due to Hashimoto's thyroiditis per review of records from previous endocrinologist  Pt is on levothyroxine 125 mcg daily,  taken: - in am - fasting - at least 30 min from b'fast - no calcium - no iron - + multivitamins at night - no PPIs - not on Biotin  Reviewed TSH levels: Lab Results  Component Value Date   TSH 1.76 06/13/2023   TSH 1.56 09/10/2022   TSH 1.06 09/01/2021   TSH 1.44 10/29/2020   TSH 5.12 (H) 09/10/2020  11/02/2019: TSH 1.07 (0.4-4.5), free T4 1.2 (0.8-1.8)  She is on OCPs for irregular menses.  In [place], she was working in the [employer].  ROS: + See HPI  I reviewed pt's medications, allergies, PMH, social hx, family hx, and changes were documented in the history of present illness. Otherwise, unchanged from my initial visit note.  Past Surgical History:  Procedure Laterality Date   BUNIONECTOMY Right 2016   CYST REMOVAL LEG Bilateral    Bilateral feet-2006 and 2016   TEAR DUCT PROBING     As a child   TONSILLECTOMY  2001   Social History   Socioeconomic History   Marital status: Married    Spouse name: Not on file   Number of children: 0   Years of education: Not on file   Highest education level: Not on file  Occupational History   Occupation: Charity fundraiser at [employer]  Tobacco Use   Smoking status: Never   Smokeless tobacco: Never  Vaping Use   Vaping status: Never Used  Substance and Sexual Activity   Alcohol use: Not Currently   Drug use: Never   Sexual activity: Not on file  Other Topics Concern   Not on file  Social History Narrative   Not on file   Social Drivers of Health   Financial Resource Strain: Not on file  Food Insecurity: Not on file  Transportation Needs: Not on file  Physical Activity: Not on file  Stress: Not on file  Social Connections: Not on file  Intimate Partner Violence: Not At Risk (03/25/2022)   [facility]    Threatened: Not on file    Insulted: Not on file    Physically Hurt : Not on file    Scream: Not on file   Current Outpatient Medications on File Prior to Visit  Medication  Sig Dispense Refill   aspirin 81 MG EC tablet daily.     Blood Glucose Monitoring Suppl (FREESTYLE LITE) w/Device KIT Use to check blood sugar 3 times a day DxCode:E10.65 1 kit 0   escitalopram (LEXAPRO) 20 MG tablet Take 1 tablet (20 mg total) by mouth daily. 90 tablet 1   glucose blood (FREESTYLE LITE) test strip Use to check blood sugar 3 times a day 300 each 12   GVOKE HYPOPEN 1-PACK 1 MG/0.2ML SOAJ Use as needed for hypoglycemia 0.2 mL PRN   Insulin Lispro-aabc (LYUMJEV) 100 UNIT/ML SOLN Inject  up to 80 units a day in the insulin pump 70 mL 3   Lancets (FREESTYLE) lancets Use to check blood sugar 3 times a day 300 each 12   levothyroxine (SYNTHROID) 125 MCG tablet Take 1 tablet (125 mcg total) by mouth every morning. 90 tablet 3   Multiple Vitamin (MULTIVITAMIN ADULT PO) Take by mouth.     norgestrel-ethinyl estradiol (ELINEST) 0.3-30 MG-MCG tablet Take 1 tablet by mouth daily. 84 tablet 3   rosuvastatin (CRESTOR) 10 MG tablet Take 1 tablet by mouth daily (Patient taking differently: Take 10 mg by mouth daily. Taking due to DM 1) 90 tablet 1   rosuvastatin (CRESTOR) 10 MG tablet Take 1 tablet (10 mg total) by mouth daily. 90 tablet 1   valACYclovir (VALTREX) 500 MG tablet Take 1 tablet (500 mg total) by mouth daily. (Patient taking differently: Take 500 mg by mouth as needed.) 90 tablet 1   No current facility-administered medications on file prior to visit.   Allergies  Allergen Reactions   Percocet [Oxycodone-Acetaminophen]     Low BP      Family History  Problem Relation Age of Onset   Colon polyps Mother    Metabolic syndrome Mother    Thyroid disease Mother    Colon polyps Father    Hyperlipidemia Father    Hypertension Father    Hypertension Sister    Colon cancer Maternal Grandmother    Esophageal cancer Neg Hx    Rectal cancer Neg Hx    Stomach cancer Neg Hx     + Grandfather and great aunt with type 2 diabetes + Grandmother with heart problems + Grandfather with  Addison's disease  PE: BP 120/70   Pulse 74   Ht 5\' 4"  (1.626 m)   Wt 168 lb 3.2 oz (76.3 kg)   SpO2 98%   BMI 28.87 kg/m  Wt Readings from Last 10 Encounters:  10/12/23 168 lb 3.2 oz (76.3 kg)  06/13/23 171 lb 9.6 oz (77.8 kg)  05/17/23 170 lb (77.1 kg)  04/20/23 170 lb (77.1 kg)  02/09/23 175 lb (79.4 kg)  09/10/22 175 lb (79.4 kg)  05/13/22 168 lb 12.8 oz (76.6 kg)  12/31/21 165 lb 12.8 oz (75.2 kg)  09/01/21 173 lb 6.4 oz (78.7 kg)  04/30/21 170 lb (77.1 kg)   Constitutional: Slightly overweight, in NAD Eyes:  EOMI, no exophthalmos ENT: no neck masses, no cervical lymphadenopathy Cardiovascular: RRR, No MRG Respiratory: CTA B Musculoskeletal: no deformities Skin:no rashes Neurological: no tremor with outstretched hands  ASSESSMENT: 1. DM1, uncontrolled, without long-term complications, but with hyperglycemia  2.  Hashimoto's Hypothyroidism  PLAN:  1. Patient with longstanding uncontrolled type 1 diabetes, managed on insulin pump: Medtronic for the last more than 25 years, currently on the 780 G model.  At last visit, HbA1c was 7.3%, stable.  She just restarted exercise and we discussed about trying not to suspend the pump, as she was doing this sometimes doing exercise with subsequently abruptly increase blood sugars.  We discussed about starting with either cardio or strength exercises depending on the blood sugar before exercise.  We also discussed about trying to bolus before each meal.  As she mentioned that she was seeing higher blood sugars at night, we increased her basal rate from 2 AM to 8:30 AM. -At last visit and again today we discussed about other pumps on the market and especially about the t:slim and the 2 pumps by [employer] (t:slim X2 and mobi).  I  feel that she would be a good candidate for these, but there are also new patch pumps coming out. CGM interpretation: -At today's visit, we reviewed her CGM downloads: It appears that 75% of values are in target  range (goal >70%), while 23% are higher than 180 (goal <25%), and 2% are lower than 70 (goal <4%).  The calculated average blood sugar is 153.  The projected HbA1c for the next 3 months (GMI) is 7.0%. -Reviewing the CGM trends, sugars appear to be slightly higher at night, then improving towards morning but increasing again afterwards and staying stable until after dinner, when they improve again.  The higher blood sugars in the middle of the night could be due to her having snacks between midnight and 1 AM.  She does not eat these to correct hypoglycemia usually, so I advised her to try to stop eating after dinner, unless she has low blood sugars.  At today's visit, since she gets 61% of the daily insulin dose from boluses and only 39 from basal, I advised her to increase her basal rates of the pump does not need to give her so many micro boluses.  We can continue the rest of the regimen. -We again reiterated ways to manage the pump during exercise, when she starts doing this consistently. - I suggested to: Patient Instructions  Please use the following pump settings: - basal rates: 12 am: 0.875 >> 0.9 units/h 2 am: 0.850 >> 0.9 1 pm: 0.875 >> 0.9 4 pm: 1.050 >> 1.1 - ICR:  12 am-5 pm: 1:11 5 pm-12 am: 1:11  - target: 110-110 - ISF: 45 - Insulin on Board: 3h   Try not to eat at night.   Please continue Levothyroxine 125 mcg daily.  Take the thyroid hormone every day, with water, at least 30 minutes before breakfast, separated by at least 4 hours from: - acid reflux medications - calcium - iron - multivitamins  Please return in 4 months.   - we checked her HbA1c: 7.3% (stable) - advised to check sugars at different times of the day - 4x a day, rotating check times - advised for yearly eye exams >> she is UTD - return to clinic in 4 months  2.  Hypothyroidism - latest thyroid labs reviewed with pt. >> normal: Lab Results  Component Value Date   TSH 1.76 06/13/2023  - she  continues on LT4 125 mcg daily - pt feels good on this dose. - we discussed about taking the thyroid hormone every day, with water, >30 minutes before breakfast, separated by >4 hours from acid reflux medications, calcium, iron, multivitamins. Pt. is taking it correctly.  Carlus Pavlov, MD PhD [facility] Endocrinology

## 2023-10-13 ENCOUNTER — Ambulatory Visit: Payer: Commercial Managed Care - PPO | Admitting: Internal Medicine

## 2023-10-31 DIAGNOSIS — M5432 Sciatica, left side: Secondary | ICD-10-CM | POA: Diagnosis not present

## 2023-10-31 DIAGNOSIS — M9903 Segmental and somatic dysfunction of lumbar region: Secondary | ICD-10-CM | POA: Diagnosis not present

## 2023-11-08 ENCOUNTER — Other Ambulatory Visit: Payer: Self-pay

## 2023-11-16 ENCOUNTER — Ambulatory Visit
Admission: RE | Admit: 2023-11-16 | Discharge: 2023-11-16 | Disposition: A | Discharge status: Home / Self Care | Payer: Commercial Managed Care - PPO | Source: Ambulatory Visit | Attending: Family Medicine | Admitting: Family Medicine

## 2023-11-16 DIAGNOSIS — R928 Other abnormal and inconclusive findings on diagnostic imaging of breast: Secondary | ICD-10-CM

## 2023-11-16 DIAGNOSIS — N6012 Diffuse cystic mastopathy of left breast: Secondary | ICD-10-CM | POA: Diagnosis not present

## 2023-11-17 ENCOUNTER — Other Ambulatory Visit: Payer: Self-pay | Admitting: Family Medicine

## 2023-11-17 DIAGNOSIS — N6320 Unspecified lump in the left breast, unspecified quadrant: Secondary | ICD-10-CM

## 2023-11-21 DIAGNOSIS — E1065 Type 1 diabetes mellitus with hyperglycemia: Secondary | ICD-10-CM | POA: Diagnosis not present

## 2023-11-21 DIAGNOSIS — E109 Type 1 diabetes mellitus without complications: Secondary | ICD-10-CM | POA: Diagnosis not present

## 2023-11-23 DIAGNOSIS — M5432 Sciatica, left side: Secondary | ICD-10-CM | POA: Diagnosis not present

## 2023-11-23 DIAGNOSIS — M9903 Segmental and somatic dysfunction of lumbar region: Secondary | ICD-10-CM | POA: Diagnosis not present

## 2023-12-04 ENCOUNTER — Other Ambulatory Visit (HOSPITAL_COMMUNITY): Payer: Self-pay

## 2023-12-05 ENCOUNTER — Other Ambulatory Visit: Payer: Self-pay

## 2023-12-05 ENCOUNTER — Other Ambulatory Visit (HOSPITAL_COMMUNITY): Payer: Self-pay

## 2023-12-05 MED ORDER — VALACYCLOVIR HCL 500 MG PO TABS
500.0000 mg | ORAL_TABLET | Freq: Every day | ORAL | 1 refills | Status: AC | PRN
  Filled 2023-12-05: qty 90, 90d supply, fill #0

## 2023-12-09 ENCOUNTER — Other Ambulatory Visit (HOSPITAL_COMMUNITY): Payer: Self-pay

## 2023-12-12 DIAGNOSIS — M5432 Sciatica, left side: Secondary | ICD-10-CM | POA: Diagnosis not present

## 2023-12-12 DIAGNOSIS — M9903 Segmental and somatic dysfunction of lumbar region: Secondary | ICD-10-CM | POA: Diagnosis not present

## 2023-12-15 ENCOUNTER — Other Ambulatory Visit: Payer: Self-pay

## 2023-12-16 ENCOUNTER — Other Ambulatory Visit (HOSPITAL_COMMUNITY): Payer: Self-pay

## 2023-12-19 ENCOUNTER — Other Ambulatory Visit: Payer: Self-pay

## 2024-01-03 ENCOUNTER — Other Ambulatory Visit: Payer: Self-pay

## 2024-01-03 DIAGNOSIS — M9903 Segmental and somatic dysfunction of lumbar region: Secondary | ICD-10-CM | POA: Diagnosis not present

## 2024-01-03 DIAGNOSIS — M5432 Sciatica, left side: Secondary | ICD-10-CM | POA: Diagnosis not present

## 2024-01-04 ENCOUNTER — Other Ambulatory Visit: Payer: Self-pay

## 2024-01-07 ENCOUNTER — Other Ambulatory Visit (HOSPITAL_COMMUNITY): Payer: Self-pay

## 2024-01-09 ENCOUNTER — Other Ambulatory Visit (HOSPITAL_COMMUNITY): Payer: Self-pay

## 2024-01-09 MED ORDER — ESCITALOPRAM OXALATE 20 MG PO TABS
20.0000 mg | ORAL_TABLET | Freq: Every day | ORAL | 1 refills | Status: AC
  Filled 2024-01-09: qty 90, 90d supply, fill #0

## 2024-01-09 MED ORDER — ROSUVASTATIN CALCIUM 10 MG PO TABS
10.0000 mg | ORAL_TABLET | Freq: Every day | ORAL | 1 refills | Status: AC
  Filled 2024-01-09: qty 90, 90d supply, fill #0

## 2024-01-24 DIAGNOSIS — M9903 Segmental and somatic dysfunction of lumbar region: Secondary | ICD-10-CM | POA: Diagnosis not present

## 2024-01-24 DIAGNOSIS — M5432 Sciatica, left side: Secondary | ICD-10-CM | POA: Diagnosis not present

## 2024-02-14 ENCOUNTER — Ambulatory Visit: Admitting: Internal Medicine

## 2024-02-17 ENCOUNTER — Other Ambulatory Visit: Payer: Self-pay

## 2024-02-20 ENCOUNTER — Ambulatory Visit: Admitting: Internal Medicine

## 2024-02-20 ENCOUNTER — Encounter: Payer: Self-pay | Admitting: Internal Medicine

## 2024-02-20 VITALS — BP 118/64 | HR 79 | Ht 64.0 in | Wt 174.8 lb

## 2024-02-20 DIAGNOSIS — E063 Autoimmune thyroiditis: Secondary | ICD-10-CM | POA: Diagnosis not present

## 2024-02-20 DIAGNOSIS — E1065 Type 1 diabetes mellitus with hyperglycemia: Secondary | ICD-10-CM

## 2024-02-20 LAB — POCT GLYCOSYLATED HEMOGLOBIN (HGB A1C): Hemoglobin A1C: 7.3 % — AB (ref 4.0–5.6)

## 2024-02-20 MED ORDER — ONETOUCH DELICA PLUS LANCET33G MISC: Status: AC

## 2024-02-20 MED ORDER — WEGOVY 0.25 MG/0.5ML ~~LOC~~ SOAJ: 0.2500 mg | SUBCUTANEOUS | 3 refills | Status: DC

## 2024-02-20 MED ORDER — ONETOUCH VERIO FLEX SYSTEM W/DEVICE KIT: PACK | Status: AC

## 2024-02-20 MED ORDER — LYUMJEV 100 UNIT/ML IJ SOLN: INTRAMUSCULAR | 3 refills | Status: AC

## 2024-02-20 MED ORDER — GLUCOSE BLOOD VI STRP: ORAL_STRIP | Status: AC

## 2024-02-20 MED ORDER — GLUCAGON 3 MG/DOSE NA POWD: 3.0000 mg | Freq: Once | NASAL | 11 refills | Status: AC | PRN

## 2024-02-20 NOTE — Patient Instructions (Addendum)
 Please use the following pump settings: - basal rates: 12 am: 0.9 2 am: 0.9 1 pm: 0.9 4 pm: 1.1 - ICR:  12 am-5 pm: 1:11 5 pm-12 am: 1:11  - target: 110-110 - ISF: 45 - Insulin  on Board: 3h   Please start: -Wegovy  0.25 mg weekly in a.m. (for example on Sunday morning) x 2-4 weeks, then increase to 0.5 mg weekly in a.m. if no nausea or hypoglycemia.   Please continue Levothyroxine  125 mcg daily.  Take the thyroid  hormone every day, with water, at least 30 minutes before breakfast, separated by at least 4 hours from: - acid reflux medications - calcium  - iron - multivitamins  Please return in 4 months.

## 2024-02-20 NOTE — Progress Notes (Signed)
 Patient ID: YAN OKRAY, female   DOB: [birth year], 48 y.o.   MRN: [id number]   HPI: Katrina Brooks is a 48 y.o.-year-old pleasant female, initially referred by her PCP, Dr. Arlana, returning for follow-up for DM1, diagnosed as a child (48 y/o), uncontrolled, without complications and Hashimoto's hypothyroidism.  She moved from [place]  2021.  Last visit 4 months ago.  Interim history: No increased urination, blurry vision, nausea, chest pain. She previously lost 20 pounds (in 2023) using the Lose it app and counting calories.  Weight loss slowed down afterwards and she gained some of the weight back.  48 year, was not able to exercise after hurting her back at the gym,  but before last visit she restarted going to the gym doing cardio and weights.  She was more busy since last visit, changing her job, and she is now not exercising consistently.  She just returned from vacation for 2 weeks.  She ate out more and sugars are higher.  Reviewed HbA1c levels: Lab Results  Component Value Date   HGBA1C 7.3 (A) 10/12/2023   HGBA1C 7.3 06/13/2023   HGBA1C 7.3 02/09/2023   HGBA1C 7.0 (A) 09/10/2022   HGBA1C 7.7 (A) 05/13/2022   HGBA1C 7.8 (A) 12/31/2021   HGBA1C 7.3 (A) 09/01/2021   HGBA1C 7.7 (A) 04/30/2021   HGBA1C 7.5 (A) 12/23/2020   HGBA1C 7.7 (A) 09/10/2020  06/13/2023: HbA1c 7.3% Previously:   Insulin  pump:  -since 48 y/o -only had Medtronic pumps -Medtronic 670 G - since 2018 - with clear ring >> got a replacement -Medtronic 770G - since 01-09/2021 -Medtronic 780G - since 04/2022  CGM: -Medtronic Guardian  Insulin : -Previously on NovoLog  - [place] approved -tried Apidra >> did not like it -tried Humalog  >> slower onset, was not controlling her sugars very well -Now Lyumjev  - [facility] in [place]  Supplies: - Medtronic   Pump settings: - basal rates: 12 am: 0.875 >> 0.9 units/h 2 am: 0.850 >> 0.9 1 pm: 0.875 >> 0.9 4 pm: 1.050 >> 1.1 - ICR:  12 am-5 pm: 1:11 5 pm-12  am: 1:11  - target: 110-110 - ISF: 45 - Insulin  on Board: 3h - bolus wizard: on - extended bolusing: not using - changes infusion site: q 5.5 >> q3-4 days TDD from basal insulin : 45% >> 44% >> 39% >> same >> 51% (21 units) TDD from bolus insulin : 55% >> 56% >> 61% >> same >> 49% Total daily dose: 36-70 >> 44-70 >> 41-70 units daily Meter: Contour Next  She checks her sugars more than 4 times a day with her CGM:  Previously:  Previously:  Lowest sugar was <40 (yardwork) >> ... 6s; she has hypoglycemia awareness in the 9s.  She has a glucagon  kit at home.  No previous hypoglycemia admission. Highest sugar was 500 (steroids for Plantar fasciitis) >> .SABRASABRA  300s (infusion site pb).  No previous DKA admissions.  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 CAC was 0 in 07/2019. On ASA 81.  - last eye exam: 04/12/2023: No DR.  - No numbness and tingling in her feet.  Last foot exam 02/09/2023.  Pt has FH of DM2 in grandfather, great aunt. Metabolic syndrome in mother.  Hypothyroidism: -Due to Hashimoto's thyroiditis per review of records from previous endocrinologist  Pt is on levothyroxine  125 mcg daily, taken: - in am - fasting -  at least 30 min from b'fast - no calcium  - no iron - + multivitamins at night - no PPIs - not on Biotin  Reviewed TSH levels: Lab Results  Component Value Date   TSH 1.76 06/13/2023   TSH 1.56 09/10/2022   TSH 1.06 09/01/2021   TSH 1.44 10/29/2020   TSH 5.12 (H) 09/10/2020   She is on OCPs for irregular menses.  In [place] , she was working in the [employer].  ROS: + See HPI  I reviewed pt's medications, allergies, PMH, social hx, family hx, and changes were documented in the history of present illness. Otherwise, unchanged from my initial visit note.  Past Surgical History:  Procedure Laterality Date   BUNIONECTOMY Right 2016   CYST REMOVAL LEG Bilateral    Bilateral feet-2006 and 2016   TEAR DUCT PROBING     As a child   TONSILLECTOMY  2001   Social History   Socioeconomic History   Marital status: Married    Spouse name: Not on file   Number of children: 0   Years of education: Not on file   Highest education level: Not on file  Occupational History   Occupation: Charity fundraiser at [employer]  Tobacco Use   Smoking status: Never   Smokeless tobacco: Never  Vaping Use   Vaping status: Never Used  Substance and Sexual Activity   Alcohol use: Not Currently   Drug use: Never   Sexual activity: Not on file  Other Topics Concern   Not on file  Social History Narrative   Not on file   Social Drivers of Health   Financial Resource Strain: Not on file  Food Insecurity: Not on file  Transportation Needs: Not on file  Physical Activity: Not on file  Stress: Not on file  Social Connections: Not on file  Intimate Partner Violence: Not At Risk (03/25/2022)   Received from [facility]    Threatened: Not on file    Insulted: Not on file    Physically Hurt : Not on file    Scream: Not on file   Current Outpatient Medications on File Prior to Visit  Medication Sig Dispense Refill   aspirin 81 MG EC tablet daily.     Blood Glucose Monitoring Suppl  (FREESTYLE LITE) w/Device KIT Use to check blood sugar 3 times a day DxCode:E10.65 1 kit 0   escitalopram  (LEXAPRO ) 20 MG tablet Take 1 tablet (20 mg total) by mouth daily. 90 tablet 1   glucose blood (FREESTYLE LITE) test strip Use to check blood sugar 3 times a day 300 each 12   GVOKE HYPOPEN  1-PACK 1 MG/0.2ML SOAJ Use as needed for hypoglycemia 0.2 mL PRN   Insulin  Lispro-aabc (LYUMJEV ) 100 UNIT/ML SOLN Inject up to 80 units a day  in the insulin  pump 70 mL 3   Lancets (FREESTYLE) lancets Use to check blood sugar 3 times a day 300 each 12   levothyroxine  (SYNTHROID ) 125 MCG tablet Take 1 tablet (125 mcg total) by mouth every morning. 90 tablet 3   Multiple Vitamin (MULTIVITAMIN ADULT PO) Take by mouth.     Multiple Vitamins-Minerals (MULTIVITAMIN WITH MINERALS) tablet Take 1 tablet by mouth daily.     norgestrel -ethinyl estradiol  (ELINEST ) 0.3-30 MG-MCG tablet Take 1 tablet by mouth daily. 84 tablet 3   rosuvastatin  (CRESTOR ) 10 MG tablet Take 1 tablet (10 mg total) by mouth daily. 90 tablet 1   valACYclovir  (VALTREX ) 500 MG tablet Take 1 tablet (500 mg total) by mouth daily. (Patient taking differently: Take 500 mg by mouth as needed.) 90 tablet 1   valACYclovir  (VALTREX ) 500 MG tablet Take 1 tablet (500 mg total) by mouth daily as needed. 90 tablet 1   No current facility-administered medications on file prior to visit.   Allergies  Allergen Reactions   Percocet [Oxycodone-Acetaminophen]     Low BP      Family History  Problem Relation Age of Onset   Colon polyps Mother    Metabolic syndrome Mother    Thyroid  disease Mother    Colon polyps Father    Hyperlipidemia Father    Hypertension Father    Hypertension Sister    Colon cancer Maternal Grandmother    Esophageal cancer Neg Hx    Rectal cancer Neg Hx    Stomach cancer Neg Hx     + Grandfather and great aunt with type 2 diabetes + Grandmother with heart problems + Grandfather with Addison's disease  PE: BP 118/64    Pulse 79   Ht 5' 4 (1.626 m)   Wt 174 lb 12.8 oz (79.3 kg)   SpO2 97%   BMI 30.00 kg/m  Wt Readings from Last 10 Encounters:  02/20/24 174 lb 12.8 oz (79.3 kg)  10/12/23 168 lb 3.2 oz (76.3 kg)  06/13/23 171 lb 9.6 oz (77.8 kg)  05/17/23 170 lb (77.1 kg)  04/20/23 170 lb (77.1 kg)  02/09/23 175 lb (79.4 kg)  09/10/22 175 lb (79.4 kg)  05/13/22 168 lb 12.8 oz (76.6 kg)  12/31/21 165 lb 12.8 oz (75.2 kg)  09/01/21 173 lb 6.4 oz (78.7 kg)   Constitutional: Slightly overweight, in NAD Eyes:  EOMI, no exophthalmos ENT: no neck masses, no cervical lymphadenopathy Cardiovascular: RRR, No MRG Respiratory: CTA B Musculoskeletal: no deformities Skin:no rashes Neurological: no tremor with outstretched hands Diabetic Foot Exam - Simple   Simple Foot Form Diabetic Foot exam was performed with the following findings: Yes 02/20/2024  8:47 AM  Visual Inspection No deformities, no ulcerations, no other skin breakdown bilaterally: Yes Sensation Testing Intact to touch and monofilament testing bilaterally: Yes Pulse Check Posterior Tibialis and Dorsalis pulse intact bilaterally: Yes Comments    ASSESSMENT: 1. DM1, uncontrolled, without long-term complications, but with hyperglycemia  2.  Hashimoto's Hypothyroidism  PLAN:  1. Patient with longstanding, uncontrolled, type 1 diabetes, on the Medtronic 780G insulin  pump, with stable control at last visit, when HbA1c returned 7.3%.  At that time, sugars appears to be slightly higher at night, then improving towards morning but increasing again afterwards and staying stable until after dinner after which they started to improve again.  The higher blood sugars in the middle of the night could have been due to having snacks between midnight and 1 AM.  We discussed about  trying to stop these.  We increased her basal rates overnight but continued the rest of the regimen.  We again discussed about how to manage the pump during exercise, which she was  doing consistently. -We previously discussed about other pumps on the market and especially about the t:slim and the 2 pumps by [employer] (t:slim X2 and mobi).  However, she prefers to continue with Medtronic. CGM interpretation: -At today's visit, we reviewed her CGM downloads: It appears that 79% of values are in target range (goal >70%), while 20% are higher than 180 (goal <25%), and 1% are lower than 70 (goal <4%).  The calculated average blood sugar is 141.  The projected HbA1c for the next 3 months (GMI) is 6.5-6.6%. -Reviewing the CGM trends, sugars appear to be improved in the last 2 weeks compared to the previous 2 weeks, despite the fact that she was on vacation and ate out more.  Lunchtime blood sugars are usually higher but reviewing the daily tracings, she sometimes does not bolus at the beginning of the meal but later, when the sugars are very high after the meal.  She does mention that whenever eating a larger meal, she would prefer to be able to do a dual wave bolus, since if she enters all of the carbs into the pump at the beginning of the meal, she may drop her blood sugars.  We did discuss that rather than doing the bolus too late, to take the pump out of the auto mode and do a dual wave bolus, and then restart the auto mode. - OTW, especially in the setting of the improvement in her blood sugars in the last 2 weeks, I did not suggest a change in her insulin  settings. -At today's visit, she inquires about the GLP-1 receptor agonist.  I do feel that she would benefit from this and we discussed about benefits and possible side effects.  Since her BMI is 30, I advised her to start Wegovy  at the low dose and increase as tolerated. - I suggested to: Patient Instructions  Please use the following pump settings: - basal rates: 12 am: 0.9 2 am: 0.9 1 pm: 0.9 4 pm: 1.1 - ICR:  12 am-5 pm: 1:11 5 pm-12 am: 1:11  - target: 110-110 - ISF: 45 - Insulin  on Board: 3h   Please start: - Wegovy   0.25 mg weekly in a.m. (for example on Sunday morning) x 2-4 weeks, then increase to 0.5 mg weekly in a.m. if no nausea or hypoglycemia.   Please continue Levothyroxine  125 mcg daily.  Take the thyroid  hormone every day, with water, at least 30 minutes before breakfast, separated by at least 4 hours from: - acid reflux medications - calcium  - iron - multivitamins  Please return in 4 months.   - we checked her HbA1c: 7.3% (stable) - advised to check sugars at different times of the day - 4x a day, rotating check times - advised for yearly eye exams >> she is UTD - return to clinic in 4 months  2.  Hypothyroidism - latest thyroid  labs reviewed with pt. >> normal: Lab Results  Component Value Date   TSH 1.76 06/13/2023  - she continues on LT4 125 mcg daily - pt feels good on this dose. - we discussed about taking the thyroid  hormone every day, with water, >30 minutes before breakfast, separated by >4 hours from acid reflux medications, calcium , iron, multivitamins. Pt. is taking it correctly. - will check thyroid  tests now  Needs LT4 refills.  Orders Placed This Encounter  Procedures   TSH   T4, free   Lipid Panel w/reflex Direct LDL   Microalbumin / creatinine urine ratio   Comprehensive metabolic panel with GFR   POCT glycosylated hemoglobin (Hb A1C)   Lela Fendt, MD PhD [facility] Endocrinology

## 2024-02-21 ENCOUNTER — Telehealth: Payer: Self-pay

## 2024-02-21 ENCOUNTER — Other Ambulatory Visit (HOSPITAL_COMMUNITY): Payer: Self-pay

## 2024-02-21 ENCOUNTER — Ambulatory Visit: Payer: Self-pay | Admitting: Internal Medicine

## 2024-02-21 LAB — COMPREHENSIVE METABOLIC PANEL WITH GFR
AG Ratio: 2 (calc) (ref 1.0–2.5)
ALT: 13 U/L (ref 6–29)
AST: 15 U/L (ref 10–35)
Albumin: 4 g/dL (ref 3.6–5.1)
Alkaline phosphatase (APISO): 69 U/L (ref 31–125)
BUN: 9 mg/dL (ref 7–25)
CO2: 25 mmol/L (ref 20–32)
Calcium: 9.3 mg/dL (ref 8.6–10.2)
Chloride: 104 mmol/L (ref 98–110)
Creat: 0.79 mg/dL (ref 0.50–0.99)
Globulin: 2 g/dL (ref 1.9–3.7)
Glucose, Bld: 194 mg/dL — ABNORMAL HIGH (ref 65–139)
Potassium: 4.7 mmol/L (ref 3.5–5.3)
Sodium: 138 mmol/L (ref 135–146)
Total Bilirubin: 0.5 mg/dL (ref 0.2–1.2)
Total Protein: 6 g/dL — ABNORMAL LOW (ref 6.1–8.1)
eGFR: 93 mL/min/1.73m2 (ref >=60)

## 2024-02-21 LAB — MICROALBUMIN / CREATININE URINE RATIO
Creatinine, Urine: 133 mg/dL (ref 20–275)
Microalb Creat Ratio: 2 mg/g{creat} (ref <30)
Microalb, Ur: 0.2 mg/dL

## 2024-02-21 LAB — LIPID PANEL W/REFLEX DIRECT LDL
Cholesterol: 140 mg/dL (ref <200)
HDL: 70 mg/dL (ref >=50)
LDL Cholesterol (Calc): 54 mg/dL
Non-HDL Cholesterol (Calc): 70 mg/dL (ref <130)
Total CHOL/HDL Ratio: 2 (calc) (ref <5.0)
Triglycerides: 84 mg/dL (ref <150)

## 2024-02-21 LAB — TSH: TSH: 2.92 m[IU]/L

## 2024-02-21 LAB — T4, FREE: Free T4: 1.2 ng/dL (ref 0.8–1.8)

## 2024-02-21 MED ORDER — LEVOTHYROXINE SODIUM 125 MCG PO TABS: 125.0000 ug | ORAL_TABLET | Freq: Every morning | ORAL | 3 refills | Status: DC

## 2024-02-21 NOTE — Telephone Encounter (Signed)
 XZB:[id number]

## 2024-02-21 NOTE — Telephone Encounter (Signed)
 Pharmacy Patient Advocate Encounter   Received notification from Pt Calls Messages that prior authorization for Wegovy  0.25mg  is required/requested.   Insurance verification completed.   The patient is insured through [employer] .   Per test claim: PA required; PA submitted to above mentioned insurance via CoverMyMeds Key/confirmation #/EOC [id number] Status is pending

## 2024-02-21 NOTE — Addendum Note (Signed)
 Addended by: TRIXIE FILE on: 02/21/2024 11:55 AM   Modules accepted: Orders

## 2024-02-23 ENCOUNTER — Other Ambulatory Visit (HOSPITAL_COMMUNITY): Payer: Self-pay

## 2024-03-11 ENCOUNTER — Encounter: Payer: Self-pay | Admitting: Internal Medicine

## 2024-03-12 NOTE — Telephone Encounter (Signed)
 Pharmacy Patient Advocate Encounter  Received notification from [employer] that Prior Authorization for Wegovy  0.25MG /0.5ML auto-injectors has been APPROVED from 02/21/24 to 09/20/24   PA #/Case ID/Reference #: [id number]

## 2024-03-28 ENCOUNTER — Encounter: Payer: Self-pay | Admitting: Internal Medicine

## 2024-04-11 ENCOUNTER — Encounter: Payer: Self-pay | Admitting: Internal Medicine

## 2024-04-11 DIAGNOSIS — E1065 Type 1 diabetes mellitus with hyperglycemia: Secondary | ICD-10-CM

## 2024-05-01 MED ORDER — WEGOVY 0.25 MG/0.5ML ~~LOC~~ SOAJ: 0.5000 mg | SUBCUTANEOUS | 5 refills | Status: DC

## 2024-05-03 ENCOUNTER — Other Ambulatory Visit: Payer: Self-pay | Admitting: Internal Medicine

## 2024-05-03 DIAGNOSIS — E1065 Type 1 diabetes mellitus with hyperglycemia: Secondary | ICD-10-CM

## 2024-05-03 MED ORDER — WEGOVY 0.25 MG/0.5ML ~~LOC~~ SOAJ: 0.5000 mg | SUBCUTANEOUS | 5 refills | Status: AC

## 2024-05-03 NOTE — Addendum Note (Signed)
 Addended by: ARLOA JEOFFREY SAILOR on: 05/03/2024 01:20 PM   Modules accepted: Orders

## 2024-05-04 ENCOUNTER — Other Ambulatory Visit: Payer: Self-pay

## 2024-05-04 MED ORDER — WEGOVY 0.5 MG/0.5ML ~~LOC~~ SOAJ: 0.5000 mg | SUBCUTANEOUS | 5 refills | Status: AC

## 2024-05-08 ENCOUNTER — Encounter

## 2024-05-08 ENCOUNTER — Other Ambulatory Visit

## 2024-05-23 ENCOUNTER — Encounter

## 2024-05-23 ENCOUNTER — Other Ambulatory Visit

## 2024-05-24 ENCOUNTER — Other Ambulatory Visit (HOSPITAL_COMMUNITY): Payer: Self-pay

## 2024-05-24 ENCOUNTER — Ambulatory Visit
Admission: RE | Admit: 2024-05-24 | Discharge: 2024-05-24 | Disposition: A | Discharge status: Home / Self Care | Source: Ambulatory Visit | Attending: Family Medicine | Admitting: Family Medicine

## 2024-05-24 DIAGNOSIS — N6320 Unspecified lump in the left breast, unspecified quadrant: Secondary | ICD-10-CM

## 2024-06-05 ENCOUNTER — Other Ambulatory Visit: Payer: Self-pay | Admitting: Family Medicine

## 2024-06-05 DIAGNOSIS — E7800 Pure hypercholesterolemia, unspecified: Secondary | ICD-10-CM

## 2024-06-06 ENCOUNTER — Ambulatory Visit
Admission: RE | Admit: 2024-06-06 | Discharge: 2024-06-06 | Disposition: A | Discharge status: Home / Self Care | Source: Ambulatory Visit | Attending: Family Medicine | Admitting: Family Medicine

## 2024-06-06 DIAGNOSIS — E7800 Pure hypercholesterolemia, unspecified: Secondary | ICD-10-CM

## 2024-06-26 ENCOUNTER — Encounter: Payer: Self-pay | Admitting: Internal Medicine

## 2024-06-26 ENCOUNTER — Ambulatory Visit: Admitting: Internal Medicine

## 2024-06-26 ENCOUNTER — Other Ambulatory Visit

## 2024-06-26 VITALS — BP 118/60 | HR 78 | Resp 18 | Wt 148.6 lb

## 2024-06-26 DIAGNOSIS — E1065 Type 1 diabetes mellitus with hyperglycemia: Secondary | ICD-10-CM | POA: Diagnosis not present

## 2024-06-26 DIAGNOSIS — E063 Autoimmune thyroiditis: Secondary | ICD-10-CM | POA: Diagnosis not present

## 2024-06-26 LAB — POCT GLYCOSYLATED HEMOGLOBIN (HGB A1C): Hemoglobin A1C: 6.6 % — AB (ref 4.0–5.6)

## 2024-06-26 NOTE — Progress Notes (Signed)
 Patient ID: Katrina Brooks, female   DOB: 08/09/1976, 48 y.o.   MRN: [id number]   HPI: Katrina Brooks is a 48 y.o.-year-old pleasant female, initially referred by her PCP, Dr. Arlana, returning for follow-up for DM1, diagnosed as a child (48 y/o), uncontrolled, without complications and Hashimoto's hypothyroidism.  She moved from [place]  2021.  Last visit 4 months ago.  Interim history: No increased urination, blurry vision, nausea, chest pain. She previously lost 20 pounds (in 2023) using the Lose it app and counting calories.  Weight loss slowed down afterwards and she gained some of the weight back.  At last visit we were able to start Wegovy .  She lost more than 25 pounds since then.  Reviewed HbA1c levels: Lab Results  Component Value Date   HGBA1C 7.3 (A) 02/20/2024   HGBA1C 7.3 (A) 10/12/2023   HGBA1C 7.3 06/13/2023   HGBA1C 7.3 02/09/2023   HGBA1C 7.0 (A) 09/10/2022   HGBA1C 7.7 (A) 05/13/2022   HGBA1C 7.8 (A) 12/31/2021   HGBA1C 7.3 (A) 09/01/2021   HGBA1C 7.7 (A) 04/30/2021   HGBA1C 7.5 (A) 12/23/2020  06/13/2023: HbA1c 7.3% Previously:   Insulin  pump:  -since 48 y/o -only had Medtronic pumps -Medtronic 670 G - since 2018 - with clear ring >> got a replacement -Medtronic 770G - since 01-09/2021 -Medtronic 780G - since 04/2022  CGM: -Medtronic Guardian  Insulin : -Previously on NovoLog  - [place] approved -tried Apidra >> did not like it -tried Humalog  >> slower onset, was not controlling her sugars very well -Now Lyumjev  - [facility] in [place]  Supplies: - Medtronic   Pump settings: - basal rates: 12 am: 0.875 >> 0.9 units/h 2 am: 0.850 >> 0.9 1 pm: 0.875 >> 0.9 4 pm: 1.050 >> 1.1 - ICR:  12 am-5 pm: 1:11 5 pm-12 am: 1:11  - target: 110-110 - ISF: 45 - Insulin  on Board: 3h - bolus wizard: on - extended bolusing: not using - changes infusion site: q 5.5 >> q3-4 days TDD from basal insulin : 44% >> 39% >> 51% (21 units) TDD from bolus insulin : 5 56% >>  61% >> 49% Total daily dose: 36-70 >> 44-70 >> 41-70 units daily Meter: Contour Next  We started: - Wegovy  0.25 >> 0.5 mg weekly - 02/2024  She checks her sugars more than 4 times a day with her CGM:  Previously:  Previously:   Lowest sugar was <40 (yardwork) >> ... 38s; she has hypoglycemia awareness in the 44s.  She has a glucagon  kit at home.  No previous hypoglycemia admission. Highest sugar was 500 (steroids for Plantar fasciitis) >> .SABRASABRA  300s (infusion site pb).  No previous DKA admissions.  Pt's meals are: - Breakfast: Coffee, turkey sausage sticks, cottage cheese, fruit; skips when not working >> mostly coffee, skips - Lunch: [employer] or sandwich, fruit - Dinner: Meat and vegetables or seafood - Snacks: several, various She works 4 days a week, 8 AM to 5:30 PM (at [employer])  -No CKD but history of MAU, last BUN/creatinine:  Lab Results  Component Value Date   BUN 9 02/20/2024   BUN 9 09/01/2021   CREATININE 0.79 02/20/2024   CREATININE 0.72 09/01/2021   Lab Results  Component Value Date   MICRALBCREAT 2 02/20/2024   -+ HL; last set of lipids: Lab Results  Component Value Date   CHOL 140 02/20/2024   HDL 70 02/20/2024   LDLCALC 54 02/20/2024   TRIG 84 02/20/2024   CHOLHDL 2.0 02/20/2024  06/01/2023: 133/57/58/63 On Crestor  10 per cardiology. CAC was 0 in 07/2019. On ASA 81.  - last eye exam: 04/18/2024: No [facility].  - No numbness and tingling in her feet.  Last foot exam 02/20/2024.  Pt has FH of DM2 in grandfather, great aunt. Metabolic syndrome in mother.  Hypothyroidism: -Due to Hashimoto's thyroiditis per review of records from previous endocrinologist  Pt is on levothyroxine  125 mcg daily, taken: - in am - fasting - at least 30 min from b'fast - no calcium  >> will start - no iron - + multivitamins at night - no PPIs - not on Biotin  Reviewed TSH levels: Lab Results  Component Value Date   TSH 2.92 02/20/2024   TSH 1.76  06/13/2023   TSH 1.56 09/10/2022   TSH 1.06 09/01/2021   TSH 1.44 10/29/2020   TSH 5.12 (H) 09/10/2020   She is on OCPs for irregular menses.  In [place] , she was working in the [employer].  ROS: + See HPI  I reviewed pt's medications, allergies, PMH, social hx, family hx, and changes were documented in the history of present illness. Otherwise, unchanged from my initial visit note.  Past Surgical History:  Procedure Laterality Date   BUNIONECTOMY Right 2016   CYST REMOVAL LEG Bilateral    Bilateral feet-2006 and 2016   TEAR DUCT PROBING     As a child   TONSILLECTOMY  2001   Social History   Socioeconomic History   Marital status: Married    Spouse name: Not on file   Number of children: 0   Years of education: Not on file   Highest education level: Not on file  Occupational History   Occupation: [employer] at [employer]  Tobacco Use   Smoking status: Never   Smokeless tobacco: Never  Vaping Use   Vaping status: Never Used  Substance and Sexual Activity   Alcohol use: Not Currently   Drug use: Never   Sexual activity: Not on file  Other Topics Concern   Not on file  Social History Narrative   Not on file   Social Drivers of Health   Financial Resource Strain: Not on file  Food Insecurity: Not on file  Transportation Needs: Not on file  Physical Activity: Not on file  Stress: Not on file  Social Connections: Not on file  Intimate Partner Violence: Not At Risk (03/25/2022)   Received from [facility]    Threatened: Not on file    Insulted: Not on file    Physically Hurt : Not on file    Scream: Not on file   Current Outpatient Medications on File Prior to Visit  Medication Sig Dispense Refill   aspirin 81 MG EC tablet daily.     Blood Glucose Monitoring Suppl (ONETOUCH VERIO FLEX SYSTEM) w/Device KIT Use as advised     escitalopram  (LEXAPRO ) 20 MG tablet Take 1 tablet (20 mg total) by mouth daily. 90 tablet 1   Glucagon  3 MG/DOSE POWD Place 3  mg into the nose once as needed for up to 1 dose. 1 each 11   glucose blood test strip Use as instructed 1x a day     Insulin  Lispro-aabc (LYUMJEV ) 100 UNIT/ML SOLN Inject up to 80 units a day in the insulin  pump 70 mL 3   Lancets (ONETOUCH DELICA PLUS LANCET33G) MISC Use 1x a day     levothyroxine  (SYNTHROID ) 125 MCG tablet Take 1 tablet (125 mcg total)  by mouth every morning. 90 tablet 3   Multiple Vitamins-Minerals (MULTIVITAMIN WITH MINERALS) tablet Take 1 tablet by mouth daily.     norgestrel -ethinyl estradiol  (ELINEST ) 0.3-30 MG-MCG tablet Take 1 tablet by mouth daily. 84 tablet 3   rosuvastatin  (CRESTOR ) 10 MG tablet Take 1 tablet (10 mg total) by mouth daily. 90 tablet 1   semaglutide -weight management (WEGOVY ) 0.25 MG/0.5ML SOAJ SQ injection Inject 0.5 mg into the skin once a week. 3 mL 5   semaglutide -weight management (WEGOVY ) 0.5 MG/0.5ML SOAJ SQ injection Inject 0.5 mg into the skin once a week. 3 mL 5   valACYclovir  (VALTREX ) 500 MG tablet Take 1 tablet (500 mg total) by mouth daily as needed. 90 tablet 1   No current facility-administered medications on file prior to visit.   Allergies  Allergen Reactions   Percocet [Oxycodone-Acetaminophen]     Low BP      Family History  Problem Relation Age of Onset   Colon polyps Mother    Metabolic syndrome Mother    Thyroid  disease Mother    Colon polyps Father    Hyperlipidemia Father    Hypertension Father    Hypertension Sister    Colon cancer Maternal Grandmother    Esophageal cancer Neg Hx    Rectal cancer Neg Hx    Stomach cancer Neg Hx     + Grandfather and great aunt with type 2 diabetes + Grandmother with heart problems + Grandfather with Addison's disease  PE: BP 118/60   Pulse 78   Resp 18   Wt 148 lb 9.6 oz (67.4 kg)   SpO2 97%   BMI 25.51 kg/m  Wt Readings from Last 10 Encounters:  06/26/24 148 lb 9.6 oz (67.4 kg)  02/20/24 174 lb 12.8 oz (79.3 kg)  10/12/23 168 lb 3.2 oz (76.3 kg)  06/13/23 171 lb  9.6 oz (77.8 kg)  05/17/23 170 lb (77.1 kg)  04/20/23 170 lb (77.1 kg)  02/09/23 175 lb (79.4 kg)  09/10/22 175 lb (79.4 kg)  05/13/22 168 lb 12.8 oz (76.6 kg)  12/31/21 165 lb 12.8 oz (75.2 kg)   Constitutional: Slightly overweight, in NAD Eyes:  EOMI, no exophthalmos ENT: no neck masses, no cervical lymphadenopathy Cardiovascular: RRR, No MRG Respiratory: CTA B Musculoskeletal: no deformities Skin:no rashes Neurological: no tremor with outstretched hands  ASSESSMENT: 1. DM1, uncontrolled, without long-term complications, but with hyperglycemia  2.  Hashimoto's Hypothyroidism  PLAN:  1. Patient with longstanding, uncontrolled, type 1 diabetes, on the Medtronic 780 G insulin  pump, with fair control.  At last visit HbA1c was 7.3%, stable. -At last visit, sugars appeared to be improved in the 2 weeks prior to the appointment compared to the previous 2 weeks despite the fact that she was in vacation and ate out more.  Lunchtime blood sugars were higher but reviewing the pump downloads this appears to be due to forgetting to take the insulin  before lunch and taking it after the sugars are already high after the meal.  We discussed about the importance of starting the boluses before the meals and we also discussed about how to do dual wave boluses, by taking the pump out of the auto mode.  We did not change the settings but upon her questioning, we started her on Wegovy  as her BMI was above 30.  I did advise her to expect the blood sugars to improve while on Wegovy  and to watch for lows.   -We previously discussed about other pumps on the  market and especially about the t:slim and the 2 pumps by [employer] (t:slim X2 and mobi).  However, she prefers to continue with Medtronic. CGM interpretation: -At today's visit, we reviewed her CGM downloads: It appears that 79% of values are in target range (goal >70%), while 19% are higher than 180 (goal <25%), and 2% are lower than 70 (goal <4%).  The  calculated average blood sugar is 142.  The projected HbA1c for the next 3 months (GMI) is 6.7%. -Reviewing the CGM trends, sugars appear to fluctuate mostly within the target range but she does have hyperglycemic excursions after every meal, particularly after dinner.  However, upon reviewing individual pump downloads, she is not in the auto mode, but only using, and carb to the pump, only doing manual boluses, and bolusing when the sugars are very high after meals.  This is conducive to hyperglycemic peaks immediately after meals and low blood sugars late after meals.  Her sugars are still overall better, since her insulin  sensitivity improved after starting Wegovy , but we discussed that she absolutely needs to enter carbs and bolus before meals and also before coffee.  She absolutely needs to start the auto mode which will also take into account insulin  on board and predicted glucose trends.  I did not suggest that the changes in her regimen. -She tells me that she did not will probably not cover Wegovy  for weight loss in the new year.  In that case, we may need to decrease the dose and possibly also to have her come off, which would be unfortunate. - I suggested to: Patient Instructions  Please use the following pump settings: - basal rates: 12 am: 0.9 2 am: 0.9 1 pm: 0.9 4 pm: 1.1 - ICR:  12 am-5 pm: 1:11 5 pm-12 am: 1:11  - target: 110-110 - ISF: 45 - Insulin  on Board: 3h   Also: -Wegovy  0.5 mg weekly  START BACK THE AUTO MODE.  BOLUS BEFORE EACH MEAL.   Please continue Levothyroxine  125 mcg daily.  Take the thyroid  hormone every day, with water, at least 30 minutes before breakfast, separated by at least 4 hours from: - acid reflux medications - calcium  - iron - multivitamins  Please return in 4 months.   - we checked her HbA1c: 6.6% (lower) - advised to check sugars at different times of the day - 4x a day, rotating check times - advised for yearly eye exams >> she is  UTD - return to clinic in 4 months  2.  Hypothyroidism - latest thyroid  labs reviewed with pt. >> normal: Lab Results  Component Value Date   TSH 2.92 02/20/2024  - she continues on LT4 125 mcg daily - pt feels good on this dose. - we discussed about taking the thyroid  hormone every day, with water, >30 minutes before breakfast, separated by >4 hours from acid reflux medications, calcium , iron, multivitamins. Pt. is taking it correctly.  - Recheck her TFTs again today due to the significant weight loss, to see if we need to decrease her LT4 dose.  Lela Fendt, MD PhD [facility]

## 2024-06-26 NOTE — Patient Instructions (Addendum)
 Please use the following pump settings: - basal rates: 12 am: 0.9 2 am: 0.9 1 pm: 0.9 4 pm: 1.1 - ICR:  12 am-5 pm: 1:11 5 pm-12 am: 1:11  - target: 110-110 - ISF: 45 - Insulin  on Board: 3h   Also: -Wegovy  0.5 mg weekly  START BACK THE AUTO MODE.  BOLUS BEFORE EACH MEAL.   Please continue Levothyroxine  125 mcg daily.  Take the thyroid  hormone every day, with water, at least 30 minutes before breakfast, separated by at least 4 hours from: - acid reflux medications - calcium  - iron - multivitamins  Please return in 4 months.

## 2024-06-27 ENCOUNTER — Ambulatory Visit: Payer: Self-pay | Admitting: Internal Medicine

## 2024-06-27 LAB — T4, FREE: Free T4: 1.4 ng/dL (ref 0.8–1.8)

## 2024-06-27 LAB — TSH: TSH: 0.27 m[IU]/L — ABNORMAL LOW

## 2024-06-27 MED ORDER — LEVOTHYROXINE SODIUM 112 MCG PO TABS: 112.0000 ug | ORAL_TABLET | Freq: Every morning | ORAL | Status: AC

## 2024-06-27 NOTE — Addendum Note (Signed)
 Addended by: TRIXIE FILE on: 06/27/2024 09:33 AM   Modules accepted: Orders

## 2024-06-27 NOTE — Addendum Note (Signed)
 Addended by: CLEOTILDE ROLIN RAMAN on: 06/27/2024 01:35 PM   Modules accepted: Orders

## 2024-08-14 ENCOUNTER — Other Ambulatory Visit

## 2024-08-15 LAB — T4, FREE: Free T4: 1.3 ng/dL (ref 0.8–1.8)

## 2024-08-15 LAB — TSH: TSH: 0.9 m[IU]/L

## 2024-10-30 ENCOUNTER — Ambulatory Visit: Admitting: Internal Medicine
